# Patient Record
Sex: Male | Born: 1940
Health system: Southern US, Community
[De-identification: ages and names within clinical notes are randomized; demographics above are authoritative.]

## PROBLEM LIST (undated history)

## (undated) DIAGNOSIS — E785 Hyperlipidemia, unspecified: Secondary | ICD-10-CM

## (undated) DIAGNOSIS — M109 Gout, unspecified: Secondary | ICD-10-CM

## (undated) DIAGNOSIS — K635 Polyp of colon: Secondary | ICD-10-CM

## (undated) DIAGNOSIS — E669 Obesity, unspecified: Secondary | ICD-10-CM

## (undated) DIAGNOSIS — E119 Type 2 diabetes mellitus without complications: Secondary | ICD-10-CM

## (undated) DIAGNOSIS — I10 Essential (primary) hypertension: Secondary | ICD-10-CM

## (undated) DIAGNOSIS — H269 Unspecified cataract: Secondary | ICD-10-CM

## (undated) HISTORY — DX: Gout, unspecified: M10.9

## (undated) HISTORY — DX: Polyp of colon: K63.5

## (undated) HISTORY — DX: Unspecified cataract: H26.9

## (undated) HISTORY — DX: Type 2 diabetes mellitus without complications: E11.9

## (undated) HISTORY — PX: CATARACT EXTRACTION W/ INTRAOCULAR LENS  IMPLANT, BILATERAL: SHX1307

## (undated) HISTORY — DX: Obesity, unspecified: E66.9

## (undated) HISTORY — DX: Essential (primary) hypertension: I10

## (undated) HISTORY — DX: Hyperlipidemia, unspecified: E78.5

---
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
## 1999-07-09 ENCOUNTER — Encounter: Payer: Self-pay | Admitting: Internal Medicine

## 1999-07-09 ENCOUNTER — Encounter: Admission: RE | Admit: 1999-07-09 | Discharge: 1999-07-09 | Payer: Self-pay | Admitting: Internal Medicine

## 2002-05-13 HISTORY — PX: COLONOSCOPY: SHX174

## 2008-11-07 ENCOUNTER — Encounter: Admission: RE | Admit: 2008-11-07 | Discharge: 2008-11-07 | Payer: Self-pay | Admitting: Internal Medicine

## 2012-10-08 ENCOUNTER — Encounter: Payer: Self-pay | Admitting: Gastroenterology

## 2012-10-14 ENCOUNTER — Encounter: Payer: Self-pay | Admitting: Gastroenterology

## 2012-11-16 ENCOUNTER — Encounter: Payer: Self-pay | Admitting: Gastroenterology

## 2013-01-15 ENCOUNTER — Ambulatory Visit (AMBULATORY_SURGERY_CENTER): Payer: Self-pay | Admitting: *Deleted

## 2013-01-15 VITALS — Ht 70.0 in | Wt 212.4 lb

## 2013-01-15 DIAGNOSIS — Z86010 Personal history of colonic polyps: Secondary | ICD-10-CM

## 2013-01-15 MED ORDER — NA SULFATE-K SULFATE-MG SULF 17.5-3.13-1.6 GM/177ML PO SOLN: 1.0000 | Freq: Once | ORAL | Status: DC

## 2013-01-15 NOTE — Progress Notes (Signed)
Denies allergies to eggs or soy products. Denies complications with sedation or anesthesia. 

## 2013-01-18 ENCOUNTER — Encounter: Payer: Self-pay | Admitting: Gastroenterology

## 2013-01-29 ENCOUNTER — Encounter: Payer: Self-pay | Admitting: Gastroenterology

## 2013-03-23 ENCOUNTER — Other Ambulatory Visit: Payer: Self-pay | Admitting: Internal Medicine

## 2013-03-23 DIAGNOSIS — N289 Disorder of kidney and ureter, unspecified: Secondary | ICD-10-CM

## 2013-03-29 ENCOUNTER — Ambulatory Visit
Admission: RE | Admit: 2013-03-29 | Discharge: 2013-03-29 | Disposition: A | Discharge status: Home / Self Care | Payer: Medicare HMO | Source: Ambulatory Visit | Attending: Internal Medicine | Admitting: Internal Medicine

## 2013-03-29 DIAGNOSIS — N289 Disorder of kidney and ureter, unspecified: Secondary | ICD-10-CM

## 2013-03-29 DIAGNOSIS — R7989 Other specified abnormal findings of blood chemistry: Secondary | ICD-10-CM

## 2013-08-02 ENCOUNTER — Encounter: Payer: Self-pay | Admitting: Gastroenterology

## 2013-09-16 ENCOUNTER — Ambulatory Visit (AMBULATORY_SURGERY_CENTER): Payer: Self-pay | Admitting: *Deleted

## 2013-09-16 VITALS — Ht 70.0 in | Wt 215.2 lb

## 2013-09-16 DIAGNOSIS — Z1211 Encounter for screening for malignant neoplasm of colon: Secondary | ICD-10-CM

## 2013-09-16 NOTE — Progress Notes (Signed)
No allergies to eggs or soy. No problems with anesthesia.  No oxygen use  No diet drug use  Pt has Suprep from previously scheduled colonoscopy

## 2013-09-22 ENCOUNTER — Encounter: Payer: Self-pay | Admitting: Gastroenterology

## 2013-09-30 ENCOUNTER — Encounter: Payer: Self-pay | Admitting: Gastroenterology

## 2013-09-30 ENCOUNTER — Ambulatory Visit (AMBULATORY_SURGERY_CENTER): Payer: Medicare HMO | Admitting: Gastroenterology

## 2013-09-30 VITALS — BP 137/68 | HR 46 | Temp 96.6°F | Resp 11 | Ht 70.0 in | Wt 215.0 lb

## 2013-09-30 DIAGNOSIS — Z1211 Encounter for screening for malignant neoplasm of colon: Secondary | ICD-10-CM

## 2013-09-30 DIAGNOSIS — D126 Benign neoplasm of colon, unspecified: Secondary | ICD-10-CM

## 2013-09-30 MED ORDER — SODIUM CHLORIDE 0.9 % IV SOLN: 500.0000 mL | INTRAVENOUS | Status: DC

## 2013-09-30 NOTE — Patient Instructions (Signed)
YOU HAD AN ENDOSCOPIC PROCEDURE TODAY AT THE [facility]: Refer to the procedure report that was given to you for any specific questions about what was found during the examination.  If the procedure report does not answer your questions, please call your gastroenterologist to clarify.  If you requested that your care partner not be given the details of your procedure findings, then the procedure report has been included in a sealed envelope for you to review at your convenience later.  YOU SHOULD EXPECT: Some feelings of bloating in the abdomen. Passage of more gas than usual.  Walking can help get rid of the air that was put into your GI tract during the procedure and reduce the bloating. If you had a lower endoscopy (such as a colonoscopy or flexible sigmoidoscopy) you may notice spotting of blood in your stool or on the toilet paper. If you underwent a bowel prep for your procedure, then you may not have a normal bowel movement for a few days.  DIET: Your first meal following the procedure should be a light meal and then it is ok to progress to your normal diet.  A half-sandwich or bowl of soup is an example of a good first meal.  Heavy or fried foods are harder to digest and may make you feel nauseous or bloated.  Likewise meals heavy in dairy and vegetables can cause extra gas to form and this can also increase the bloating.  Drink plenty of fluids but you should avoid alcoholic beverages for 24 hours.  ACTIVITY: Your care partner should take you home directly after the procedure.  You should plan to take it easy, moving slowly for the rest of the day.  You can resume normal activity the day after the procedure however you should NOT DRIVE or use heavy machinery for 24 hours (because of the sedation medicines used during the test).    SYMPTOMS TO REPORT IMMEDIATELY: A gastroenterologist can be reached at any hour.  During normal business hours, 8:30 AM to 5:00 PM Monday through Friday,  call [phone].  After hours and on weekends, please call the GI answering service at [phone] who will take a message and have the physician on call contact you.   Following lower endoscopy (colonoscopy or flexible sigmoidoscopy):  Excessive amounts of blood in the stool  Significant tenderness or worsening of abdominal pains  Swelling of the abdomen that is new, acute  Fever of 100F or higher    FOLLOW UP: If any biopsies were taken you will be contacted by phone or by letter within the next 1-3 weeks.  Call your gastroenterologist if you have not heard about the biopsies in 3 weeks.  Our staff will call the home number listed on your records the next business day following your procedure to check on you and address any questions or concerns that you may have at that time regarding the information given to you following your procedure. This is a courtesy call and so if there is no answer at the home number and we have not heard from you through the emergency physician on call, we will assume that you have returned to your regular daily activities without incident.  SIGNATURES/CONFIDENTIALITY: You and/or your care partner have signed paperwork which will be entered into your electronic medical record.  These signatures attest to the fact that that the information above on your After Visit Summary has been reviewed and is understood.  Full responsibility of the confidentiality   of this discharge information lies with you and/or your care-partner.     

## 2013-09-30 NOTE — Progress Notes (Signed)
To PACU, report to RN awake and alert.

## 2013-09-30 NOTE — Op Note (Signed)
[place]  [address] [place], [id number]   COLONOSCOPY PROCEDURE REPORT  PATIENT: Andre, Chen  MR#: [id number] BIRTHDATE: [birth year] , 4  yrs. old GENDER: Male ENDOSCOPIST: Inda Castle, MD REFERRED OA:CZYSA Nyoka Cowden, M.D. PROCEDURE DATE:  09/30/2013 PROCEDURE:   Colonoscopy with cold biopsy polypectomy First Screening Colonoscopy - Avg.  risk and is 50 yrs.  old or older - No.  Prior Negative Screening - Now for repeat screening. 10 or more years since last screening  History of Adenoma - Now for follow-up colonoscopy & has been > or = to 3 yrs.  N/A  Polyps Removed Today? Yes. ASA CLASS:   Class II INDICATIONS:Average risk patient for colon cancer. MEDICATIONS: MAC sedation, administered by CRNA and propofol (Diprivan) 200mg  IV  DESCRIPTION OF PROCEDURE:   After the risks benefits and alternatives of the procedure were thoroughly explained, informed consent was obtained.  A digital rectal exam revealed no abnormalities of the rectum.   The LB [id number]  endoscope was introduced through the anus and advanced to the cecum, which was identified by both the appendix and ileocecal valve. No adverse events experienced.   The quality of the prep was excellent using Suprep  The instrument was then slowly withdrawn as the colon was fully examined.      COLON FINDINGS: A sessile polyp measuring 2 mm in size was found in the descending colon.  A polypectomy was performed with cold forceps.   The colon was otherwise normal.  There was no diverticulosis, inflammation, polyps or cancers unless previously stated.  Retroflexed views revealed no abnormalities. The time to cecum=3 minutes 21 seconds.  Withdrawal time=14 minutes 27 seconds. The scope was withdrawn and the procedure completed. COMPLICATIONS: There were no complications.  ENDOSCOPIC IMPRESSION: 1.   Sessile polyp measuring 2 mm in size was found in the descending colon; polypectomy was  performed with cold forceps 2.   The colon was otherwise normal  RECOMMENDATIONS: If the polyp(s) removed today are proven to be adenomatous (pre-cancerous) polyps, you will need a repeat colonoscopy in 5 years.  Otherwise you should continue to follow colorectal cancer screening guidelines for "routine risk" patients with colonoscopy in 10 years.  You will receive a letter within 1-2 weeks with the results of your biopsy as well as final recommendations.  Please call my office if you have not received a letter after 3 weeks.   eSigned:  Inda Castle, MD 09/30/2013 10:05 AM   cc:   PATIENT NAME:  Andre Chen, Andre Chen MR#: [id number]

## 2013-09-30 NOTE — Progress Notes (Signed)
Called to room to assist during endoscopic procedure.  Patient ID and intended procedure confirmed with present staff. Received instructions for my participation in the procedure from the performing physician.  

## 2013-10-01 ENCOUNTER — Telehealth: Payer: Self-pay | Admitting: *Deleted

## 2013-10-01 NOTE — Telephone Encounter (Signed)
  Follow up Call-  Call back number 09/30/2013  Post procedure Call Back phone  # [phone]  Permission to leave phone message Yes     Patient questions:  Message left for patient to call us if necessary.

## 2013-10-08 ENCOUNTER — Encounter: Payer: Self-pay | Admitting: Gastroenterology

## 2014-02-14 ENCOUNTER — Other Ambulatory Visit: Payer: Self-pay | Admitting: Internal Medicine

## 2014-02-14 ENCOUNTER — Ambulatory Visit
Admission: RE | Admit: 2014-02-14 | Discharge: 2014-02-14 | Disposition: A | Discharge status: Home / Self Care | Payer: Medicare HMO | Source: Ambulatory Visit | Attending: Internal Medicine | Admitting: Internal Medicine

## 2014-02-14 DIAGNOSIS — R059 Cough, unspecified: Secondary | ICD-10-CM

## 2014-02-14 DIAGNOSIS — R05 Cough: Secondary | ICD-10-CM

## 2014-06-02 ENCOUNTER — Other Ambulatory Visit: Payer: Self-pay | Admitting: Internal Medicine

## 2014-06-02 DIAGNOSIS — R7989 Other specified abnormal findings of blood chemistry: Secondary | ICD-10-CM

## 2014-06-02 DIAGNOSIS — R945 Abnormal results of liver function studies: Secondary | ICD-10-CM

## 2014-06-02 DIAGNOSIS — N289 Disorder of kidney and ureter, unspecified: Secondary | ICD-10-CM

## 2014-06-09 ENCOUNTER — Ambulatory Visit
Admission: RE | Admit: 2014-06-09 | Discharge: 2014-06-09 | Disposition: A | Discharge status: Home / Self Care | Payer: PPO | Source: Ambulatory Visit | Attending: Internal Medicine | Admitting: Internal Medicine

## 2014-06-09 DIAGNOSIS — N289 Disorder of kidney and ureter, unspecified: Secondary | ICD-10-CM

## 2014-06-09 DIAGNOSIS — R7989 Other specified abnormal findings of blood chemistry: Secondary | ICD-10-CM

## 2014-06-09 DIAGNOSIS — R945 Abnormal results of liver function studies: Secondary | ICD-10-CM

## 2015-12-11 DIAGNOSIS — E7800 Pure hypercholesterolemia, unspecified: Secondary | ICD-10-CM | POA: Diagnosis not present

## 2015-12-11 DIAGNOSIS — Z0000 Encounter for general adult medical examination without abnormal findings: Secondary | ICD-10-CM | POA: Diagnosis not present

## 2015-12-11 DIAGNOSIS — H6120 Impacted cerumen, unspecified ear: Secondary | ICD-10-CM | POA: Diagnosis not present

## 2015-12-11 DIAGNOSIS — Z125 Encounter for screening for malignant neoplasm of prostate: Secondary | ICD-10-CM | POA: Diagnosis not present

## 2015-12-11 DIAGNOSIS — D559 Anemia due to enzyme disorder, unspecified: Secondary | ICD-10-CM | POA: Diagnosis not present

## 2015-12-11 DIAGNOSIS — I10 Essential (primary) hypertension: Secondary | ICD-10-CM | POA: Diagnosis not present

## 2015-12-11 DIAGNOSIS — M109 Gout, unspecified: Secondary | ICD-10-CM | POA: Diagnosis not present

## 2016-01-16 DIAGNOSIS — I10 Essential (primary) hypertension: Secondary | ICD-10-CM | POA: Diagnosis not present

## 2016-01-16 DIAGNOSIS — Z23 Encounter for immunization: Secondary | ICD-10-CM | POA: Diagnosis not present

## 2016-01-16 DIAGNOSIS — D559 Anemia due to enzyme disorder, unspecified: Secondary | ICD-10-CM | POA: Diagnosis not present

## 2016-01-16 DIAGNOSIS — R799 Abnormal finding of blood chemistry, unspecified: Secondary | ICD-10-CM | POA: Diagnosis not present

## 2016-02-14 ENCOUNTER — Other Ambulatory Visit (HOSPITAL_COMMUNITY): Payer: Self-pay | Admitting: Internal Medicine

## 2016-02-19 ENCOUNTER — Other Ambulatory Visit: Payer: Self-pay | Admitting: Internal Medicine

## 2016-02-21 ENCOUNTER — Ambulatory Visit (HOSPITAL_COMMUNITY)
Admission: RE | Admit: 2016-02-21 | Discharge: 2016-02-21 | Disposition: A | Discharge status: Home / Self Care | Payer: PPO | Source: Ambulatory Visit | Attending: Internal Medicine | Admitting: Internal Medicine

## 2016-02-21 DIAGNOSIS — I340 Nonrheumatic mitral (valve) insufficiency: Secondary | ICD-10-CM | POA: Diagnosis not present

## 2016-02-21 LAB — CREATININE, SERUM
Creatinine, Ser: 1.82 mg/dL — ABNORMAL HIGH (ref 0.61–1.24)
GFR calc Af Amer: 41 mL/min — ABNORMAL LOW (ref >60)
GFR calc non Af Amer: 35 mL/min — ABNORMAL LOW (ref >60)

## 2016-02-21 MED ORDER — GADOBENATE DIMEGLUMINE 529 MG/ML IV SOLN
40.0000 mL | Freq: Once | INTRAVENOUS | Status: AC
  Administered 2016-02-21: 38 mL via INTRAVENOUS

## 2016-03-08 ENCOUNTER — Other Ambulatory Visit: Payer: PPO

## 2016-03-18 ENCOUNTER — Ambulatory Visit
Admission: RE | Admit: 2016-03-18 | Discharge: 2016-03-18 | Disposition: A | Discharge status: Home / Self Care | Payer: PPO | Source: Ambulatory Visit | Attending: Internal Medicine | Admitting: Internal Medicine

## 2016-03-18 MED ORDER — GADOBENATE DIMEGLUMINE 529 MG/ML IV SOLN
10.0000 mL | Freq: Once | INTRAVENOUS | Status: AC | PRN
  Administered 2016-03-18: 10 mL via INTRAVENOUS

## 2016-04-18 DIAGNOSIS — R799 Abnormal finding of blood chemistry, unspecified: Secondary | ICD-10-CM | POA: Diagnosis not present

## 2016-04-18 DIAGNOSIS — I10 Essential (primary) hypertension: Secondary | ICD-10-CM | POA: Diagnosis not present

## 2016-04-18 DIAGNOSIS — M109 Gout, unspecified: Secondary | ICD-10-CM | POA: Diagnosis not present

## 2016-07-08 DIAGNOSIS — M1000 Idiopathic gout, unspecified site: Secondary | ICD-10-CM | POA: Diagnosis not present

## 2016-07-08 DIAGNOSIS — A35 Other tetanus: Secondary | ICD-10-CM | POA: Diagnosis not present

## 2016-07-08 DIAGNOSIS — R945 Abnormal results of liver function studies: Secondary | ICD-10-CM | POA: Diagnosis not present

## 2016-07-08 DIAGNOSIS — D559 Anemia due to enzyme disorder, unspecified: Secondary | ICD-10-CM | POA: Diagnosis not present

## 2016-07-08 DIAGNOSIS — I10 Essential (primary) hypertension: Secondary | ICD-10-CM | POA: Diagnosis not present

## 2016-11-19 DIAGNOSIS — H26493 Other secondary cataract, bilateral: Secondary | ICD-10-CM | POA: Diagnosis not present

## 2016-11-19 DIAGNOSIS — H52221 Regular astigmatism, right eye: Secondary | ICD-10-CM | POA: Diagnosis not present

## 2016-11-19 DIAGNOSIS — H53011 Deprivation amblyopia, right eye: Secondary | ICD-10-CM | POA: Diagnosis not present

## 2016-12-12 DIAGNOSIS — N189 Chronic kidney disease, unspecified: Secondary | ICD-10-CM | POA: Diagnosis not present

## 2016-12-12 DIAGNOSIS — Z0000 Encounter for general adult medical examination without abnormal findings: Secondary | ICD-10-CM | POA: Diagnosis not present

## 2016-12-12 DIAGNOSIS — M1000 Idiopathic gout, unspecified site: Secondary | ICD-10-CM | POA: Diagnosis not present

## 2016-12-12 DIAGNOSIS — E790 Hyperuricemia without signs of inflammatory arthritis and tophaceous disease: Secondary | ICD-10-CM | POA: Diagnosis not present

## 2016-12-12 DIAGNOSIS — K635 Polyp of colon: Secondary | ICD-10-CM | POA: Diagnosis not present

## 2016-12-12 DIAGNOSIS — R945 Abnormal results of liver function studies: Secondary | ICD-10-CM | POA: Diagnosis not present

## 2016-12-12 DIAGNOSIS — Z6841 Body Mass Index (BMI) 40.0 and over, adult: Secondary | ICD-10-CM | POA: Diagnosis not present

## 2016-12-13 ENCOUNTER — Other Ambulatory Visit: Payer: Self-pay | Admitting: Internal Medicine

## 2016-12-16 ENCOUNTER — Other Ambulatory Visit: Payer: Self-pay | Admitting: Internal Medicine

## 2016-12-16 DIAGNOSIS — N2889 Other specified disorders of kidney and ureter: Secondary | ICD-10-CM

## 2016-12-30 ENCOUNTER — Other Ambulatory Visit: Payer: Self-pay | Admitting: Internal Medicine

## 2016-12-30 ENCOUNTER — Ambulatory Visit
Admission: RE | Admit: 2016-12-30 | Discharge: 2016-12-30 | Disposition: A | Discharge status: Home / Self Care | Payer: PPO | Source: Ambulatory Visit | Attending: Internal Medicine | Admitting: Internal Medicine

## 2016-12-30 DIAGNOSIS — N281 Cyst of kidney, acquired: Secondary | ICD-10-CM | POA: Diagnosis not present

## 2016-12-30 DIAGNOSIS — N2889 Other specified disorders of kidney and ureter: Secondary | ICD-10-CM

## 2017-02-18 DIAGNOSIS — Z23 Encounter for immunization: Secondary | ICD-10-CM | POA: Diagnosis not present

## 2017-02-18 DIAGNOSIS — R945 Abnormal results of liver function studies: Secondary | ICD-10-CM | POA: Diagnosis not present

## 2017-02-18 DIAGNOSIS — N189 Chronic kidney disease, unspecified: Secondary | ICD-10-CM | POA: Diagnosis not present

## 2017-04-23 DIAGNOSIS — I10 Essential (primary) hypertension: Secondary | ICD-10-CM | POA: Diagnosis not present

## 2017-04-23 DIAGNOSIS — M1000 Idiopathic gout, unspecified site: Secondary | ICD-10-CM | POA: Diagnosis not present

## 2017-04-23 DIAGNOSIS — E790 Hyperuricemia without signs of inflammatory arthritis and tophaceous disease: Secondary | ICD-10-CM | POA: Diagnosis not present

## 2017-08-26 DIAGNOSIS — N281 Cyst of kidney, acquired: Secondary | ICD-10-CM | POA: Diagnosis not present

## 2017-08-26 DIAGNOSIS — M109 Gout, unspecified: Secondary | ICD-10-CM | POA: Diagnosis not present

## 2017-08-26 DIAGNOSIS — I10 Essential (primary) hypertension: Secondary | ICD-10-CM | POA: Diagnosis not present

## 2017-12-25 DIAGNOSIS — I10 Essential (primary) hypertension: Secondary | ICD-10-CM | POA: Diagnosis not present

## 2017-12-25 DIAGNOSIS — M109 Gout, unspecified: Secondary | ICD-10-CM | POA: Diagnosis not present

## 2017-12-25 DIAGNOSIS — K635 Polyp of colon: Secondary | ICD-10-CM | POA: Diagnosis not present

## 2017-12-25 DIAGNOSIS — Z1211 Encounter for screening for malignant neoplasm of colon: Secondary | ICD-10-CM | POA: Diagnosis not present

## 2017-12-25 DIAGNOSIS — R945 Abnormal results of liver function studies: Secondary | ICD-10-CM | POA: Diagnosis not present

## 2017-12-26 ENCOUNTER — Other Ambulatory Visit: Payer: Self-pay | Admitting: Internal Medicine

## 2017-12-26 DIAGNOSIS — N281 Cyst of kidney, acquired: Secondary | ICD-10-CM

## 2017-12-29 ENCOUNTER — Ambulatory Visit
Admission: RE | Admit: 2017-12-29 | Discharge: 2017-12-29 | Disposition: A | Discharge status: Home / Self Care | Payer: PPO | Source: Ambulatory Visit | Attending: Internal Medicine | Admitting: Internal Medicine

## 2017-12-29 DIAGNOSIS — N281 Cyst of kidney, acquired: Secondary | ICD-10-CM

## 2018-02-19 DIAGNOSIS — Z23 Encounter for immunization: Secondary | ICD-10-CM | POA: Diagnosis not present

## 2018-09-17 ENCOUNTER — Encounter: Payer: Self-pay | Admitting: Gastroenterology

## 2018-11-03 IMAGING — MR MR ABDOMEN W/O CM
6 of 8 series · 30 of 48 positions shown · non-contrast
Comparison: 03/18/2016

CLINICAL DATA: Follow-up Bosniak IIF lesion in the right kidney

EXAM:
MRI ABDOMEN WITHOUT CONTRAST
TECHNIQUE: Multiplanar multisequence MR imaging was performed without the
administration of intravenous contrast. Contrast could not be
administered due to patient's low GFR.

[Series 3: T2 · coronal · 5.0mm · 1.56mm/px · 3 of 20 slices shown (1 of 3)]
[im 1/20]
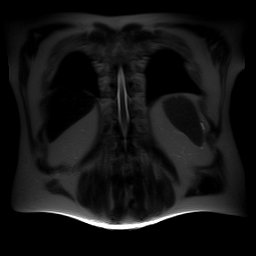
[im 10/20]
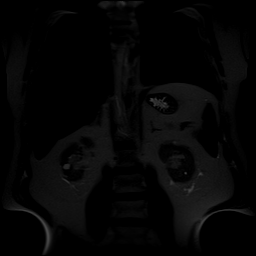
[im 20/20]
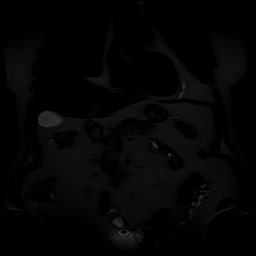

[Series 4: axial tru fisp · axial · 4.0mm · 1.48mm/px · z∈[-107,+75]mm · 5 of 36 slices shown]
[im 1/36]
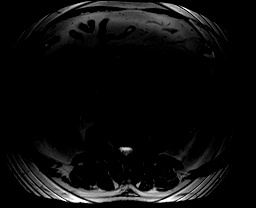
[im 9/36]
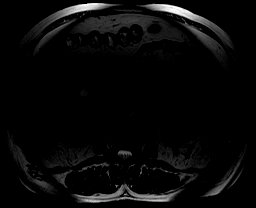
[im 18/36]
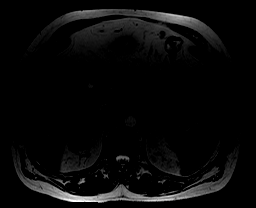
[im 27/36]
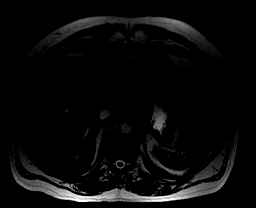
[im 36/36]
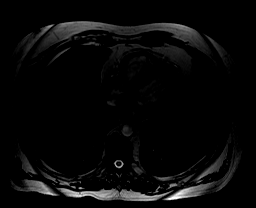

[Series 7: T2 · axial · 6.5mm · 0.74mm/px · z∈[-117,+101]mm · 4 of 29 slices shown (2 of 3)]
[im 1/29]
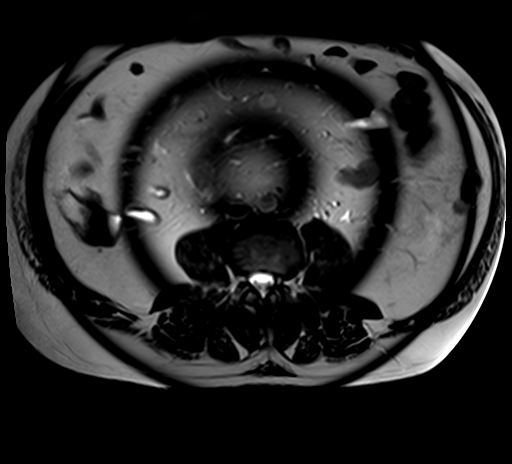
[im 10/29]
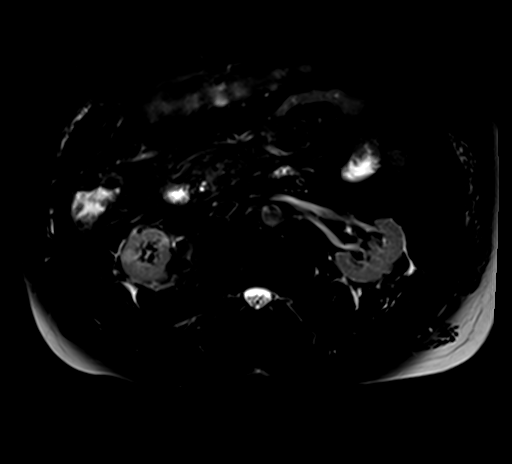
[im 19/29]
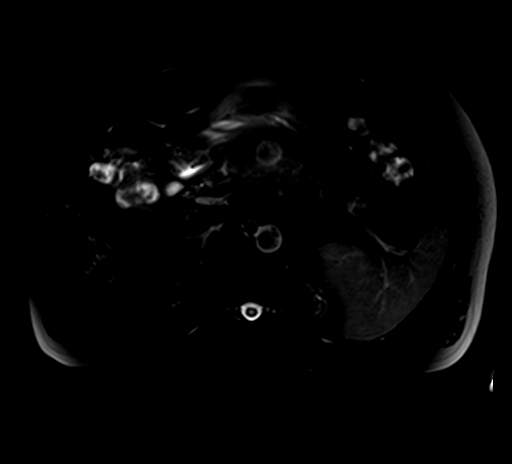
[im 29/29]
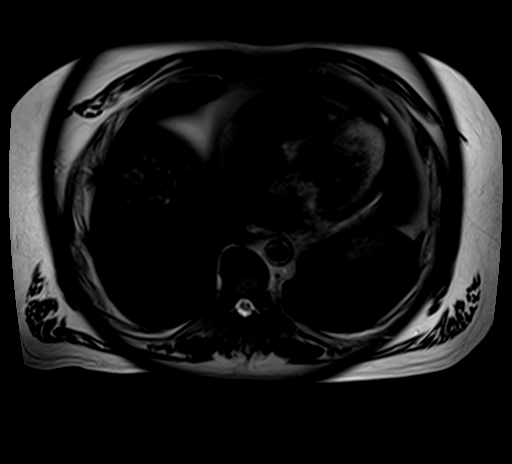

[Series 8: T2 · axial · 5.0mm · 1.48mm/px · z∈[-119,+96]mm · 4 of 34 slices shown (3 of 3)]
[im 1/34]
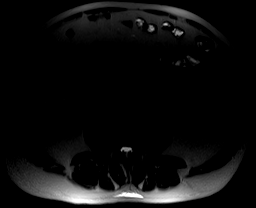
[im 12/34]
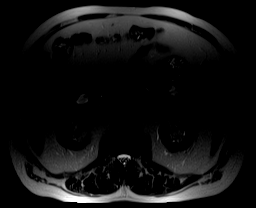
[im 23/34]
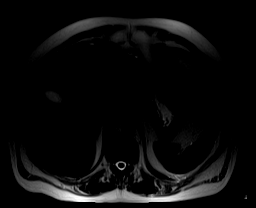
[im 34/34]
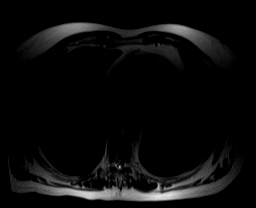

[Series 11: axial in out · axial · 5.5mm · 0.74mm/px · z∈[-110,+87]mm · 8 of 64 slices shown]
[im 1/64]
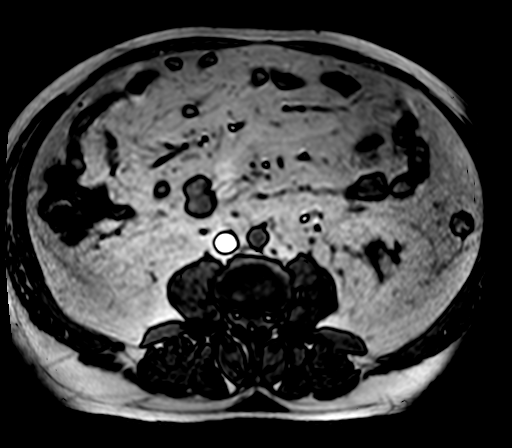
[im 10/64]
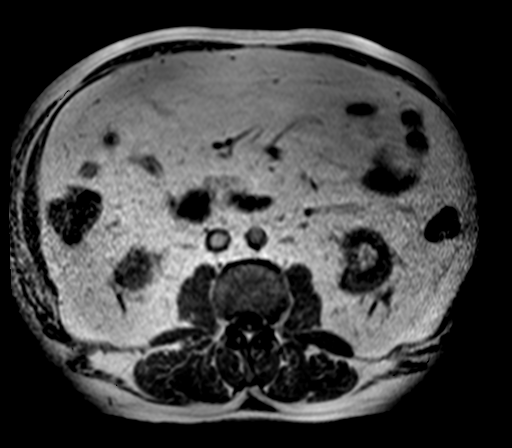
[im 19/64]
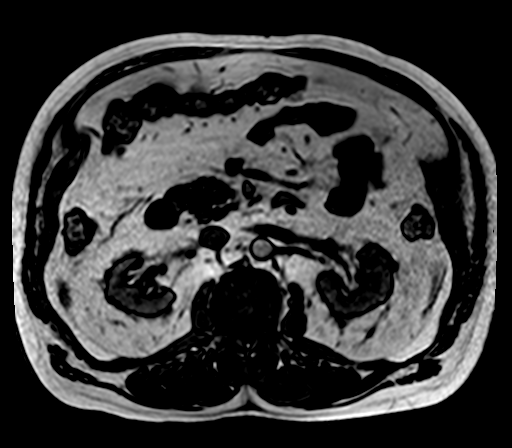
[im 28/64]
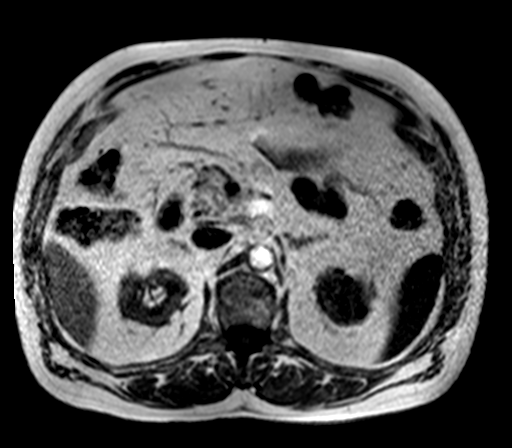
[im 37/64]
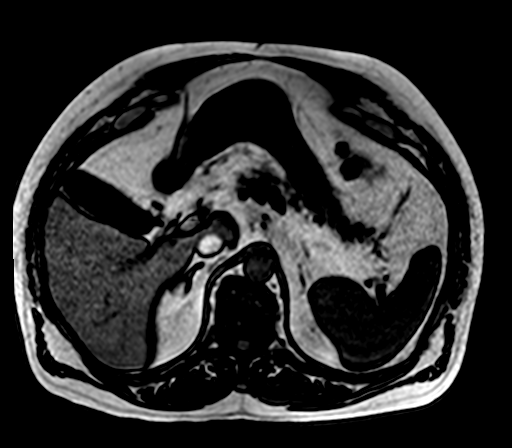
[im 46/64]
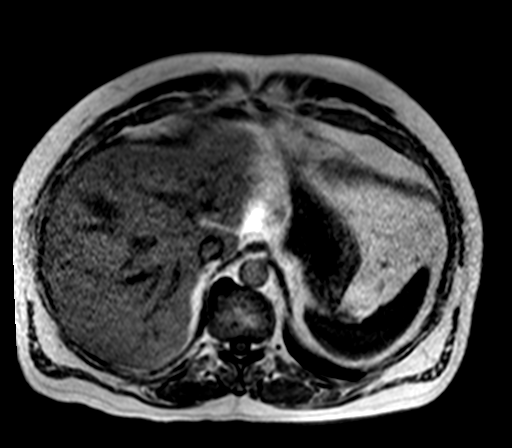
[im 55/64]
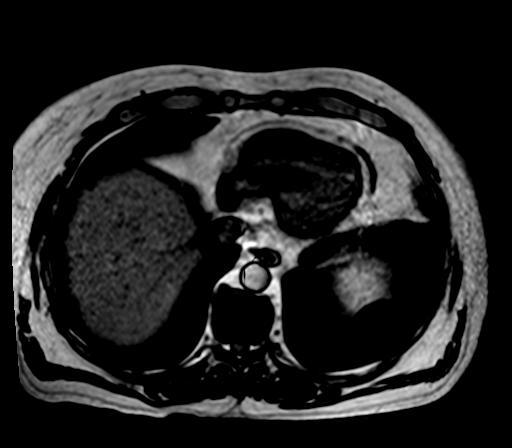
[im 64/64]
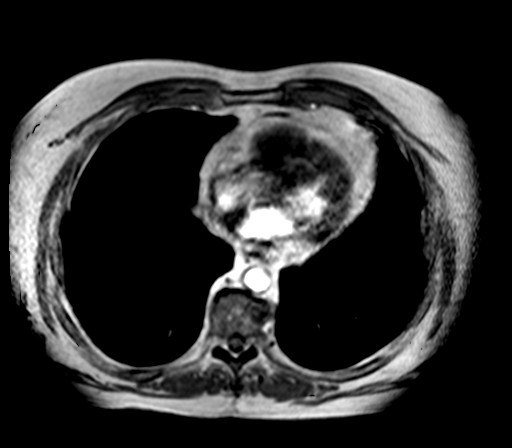

[Series 12: T1 dynamic · axial · non-contrast · 2.5mm · 0.78mm/px · z∈[-110,+27]mm · 6 of 80 slices shown]
[im 1/80]
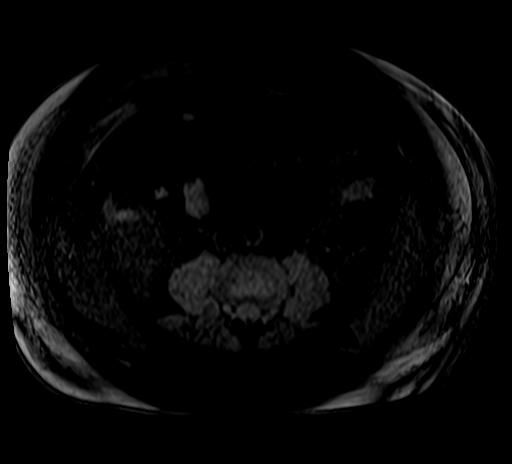
[im 16/80]
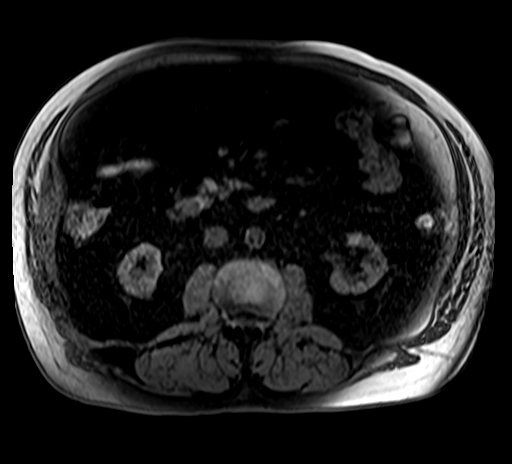
[im 24/80]
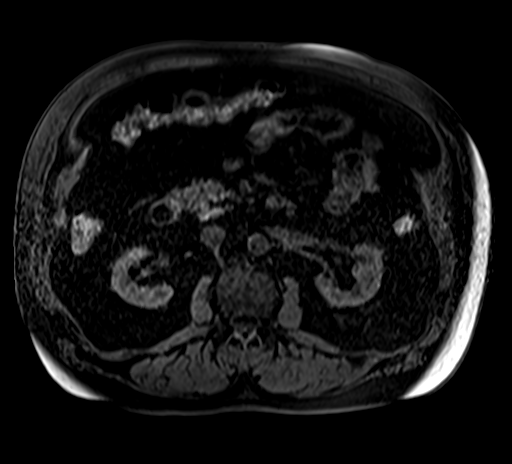
[im 32/80]
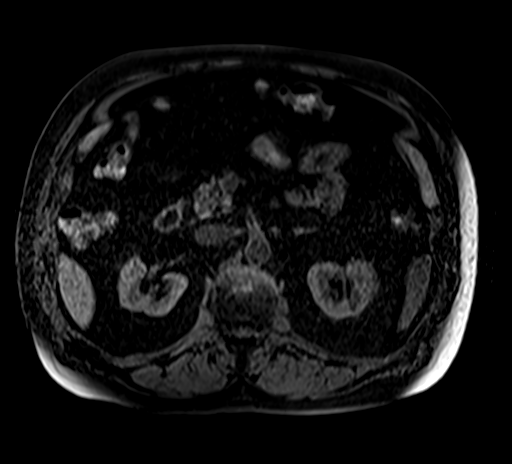
[im 48/80]
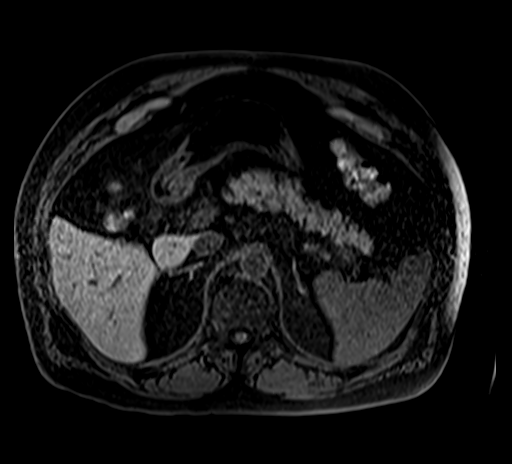
[im 56/80]
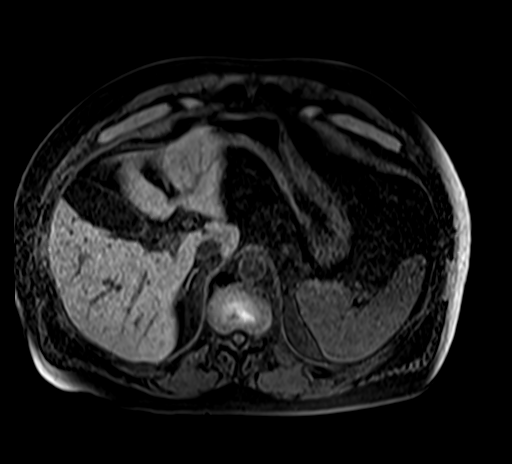

[30 of 48 positions shown; findings below may reference images not displayed]

FINDINGS: Lower chest: Lung bases are clear.

Hepatobiliary: Liver is within normal limits.

Gallbladder is unremarkable. No intrahepatic or extrahepatic ductal
dilatation.

Pancreas:  Within normal limits.

Spleen:  Within normal limits.

Adrenals/Urinary Tract:  Adrenal glands are within normal limits.

1.4 cm irregular/septated cyst in the lateral right lower kidney
(series 7/image 18). This is incompletely characterized in the
absence of intravenous contrast administration, but remains
compatible with a Bosniak IIF lesion.

Simple left renal cysts measuring up to 10 mm in the upper pole
(series 7/ image 15). No hydronephrosis.

Stomach/Bowel: Stomach and visualized bowel are unremarkable.

Vascular/Lymphatic:  No evidence of abdominal aortic aneurysm.

No suspicious abdominal lymphadenopathy.

Other:  No abdominal ascites.

Musculoskeletal: No focal osseous lesions.
IMPRESSION: 14 mm irregular/septated cyst in the lateral right lower kidney,
incompletely characterized in the absence of intravenous contrast,
Bosniak IIF.

Given the small size and the patient's age, consider follow-up MR in
12 months, as clinically warranted.

## 2019-01-21 DIAGNOSIS — I10 Essential (primary) hypertension: Secondary | ICD-10-CM | POA: Diagnosis not present

## 2019-01-21 DIAGNOSIS — E790 Hyperuricemia without signs of inflammatory arthritis and tophaceous disease: Secondary | ICD-10-CM | POA: Diagnosis not present

## 2019-01-21 DIAGNOSIS — Z125 Encounter for screening for malignant neoplasm of prostate: Secondary | ICD-10-CM | POA: Diagnosis not present

## 2019-01-21 DIAGNOSIS — K635 Polyp of colon: Secondary | ICD-10-CM | POA: Diagnosis not present

## 2019-01-21 DIAGNOSIS — Z1211 Encounter for screening for malignant neoplasm of colon: Secondary | ICD-10-CM | POA: Diagnosis not present

## 2019-01-21 DIAGNOSIS — M1000 Idiopathic gout, unspecified site: Secondary | ICD-10-CM | POA: Diagnosis not present

## 2019-01-21 DIAGNOSIS — M109 Gout, unspecified: Secondary | ICD-10-CM | POA: Diagnosis not present

## 2019-01-21 DIAGNOSIS — N200 Calculus of kidney: Secondary | ICD-10-CM | POA: Diagnosis not present

## 2019-01-21 DIAGNOSIS — C4492 Squamous cell carcinoma of skin, unspecified: Secondary | ICD-10-CM | POA: Diagnosis not present

## 2019-01-21 DIAGNOSIS — Z23 Encounter for immunization: Secondary | ICD-10-CM | POA: Diagnosis not present

## 2019-05-19 DIAGNOSIS — Z1331 Encounter for screening for depression: Secondary | ICD-10-CM | POA: Diagnosis not present

## 2019-05-19 DIAGNOSIS — R7989 Other specified abnormal findings of blood chemistry: Secondary | ICD-10-CM | POA: Diagnosis not present

## 2019-05-19 DIAGNOSIS — I10 Essential (primary) hypertension: Secondary | ICD-10-CM | POA: Diagnosis not present

## 2019-05-19 DIAGNOSIS — N1831 Chronic kidney disease, stage 3a: Secondary | ICD-10-CM | POA: Diagnosis not present

## 2019-05-19 DIAGNOSIS — N281 Cyst of kidney, acquired: Secondary | ICD-10-CM | POA: Diagnosis not present

## 2019-05-19 DIAGNOSIS — Z85828 Personal history of other malignant neoplasm of skin: Secondary | ICD-10-CM | POA: Diagnosis not present

## 2019-05-19 DIAGNOSIS — M109 Gout, unspecified: Secondary | ICD-10-CM | POA: Diagnosis not present

## 2019-05-19 DIAGNOSIS — R351 Nocturia: Secondary | ICD-10-CM | POA: Diagnosis not present

## 2019-06-25 DIAGNOSIS — Z7689 Persons encountering health services in other specified circumstances: Secondary | ICD-10-CM | POA: Diagnosis not present

## 2019-06-25 DIAGNOSIS — R7989 Other specified abnormal findings of blood chemistry: Secondary | ICD-10-CM | POA: Diagnosis not present

## 2019-06-29 ENCOUNTER — Encounter: Payer: Self-pay | Admitting: Gastroenterology

## 2019-07-01 ENCOUNTER — Telehealth: Payer: Self-pay | Admitting: Hematology

## 2019-07-01 NOTE — Telephone Encounter (Signed)
Received a new pt referral from Dr. Francesco Sor at [facility] for elevated ferritin, eval for hemochromatosis. I cld and spoke to the pt's wife to schedule an appt on 3/10 at 10am. Aware to arrive 15 minutes early.

## 2019-07-21 ENCOUNTER — Inpatient Hospital Stay: Payer: PPO

## 2019-07-21 ENCOUNTER — Inpatient Hospital Stay: Payer: PPO | Attending: Hematology | Admitting: Hematology

## 2019-07-21 ENCOUNTER — Other Ambulatory Visit: Payer: Self-pay

## 2019-07-21 DIAGNOSIS — R945 Abnormal results of liver function studies: Secondary | ICD-10-CM

## 2019-07-21 DIAGNOSIS — R7989 Other specified abnormal findings of blood chemistry: Secondary | ICD-10-CM | POA: Diagnosis not present

## 2019-07-21 DIAGNOSIS — N281 Cyst of kidney, acquired: Secondary | ICD-10-CM | POA: Diagnosis not present

## 2019-07-21 LAB — CMP (CANCER CENTER ONLY)
ALT: 86 U/L — ABNORMAL HIGH (ref 0–44)
AST: 39 U/L (ref 15–41)
Albumin: 4 g/dL (ref 3.5–5.0)
Alkaline Phosphatase: 88 U/L (ref 38–126)
Anion gap: 8 (ref 5–15)
BUN: 41 mg/dL — ABNORMAL HIGH (ref 8–23)
CO2: 27 mmol/L (ref 22–32)
Calcium: 9.6 mg/dL (ref 8.9–10.3)
Chloride: 105 mmol/L (ref 98–111)
Creatinine: 1.84 mg/dL — ABNORMAL HIGH (ref 0.61–1.24)
GFR, Est AFR Am: 40 mL/min — ABNORMAL LOW (ref >60)
GFR, Estimated: 35 mL/min — ABNORMAL LOW (ref >60)
Glucose, Bld: 286 mg/dL — ABNORMAL HIGH (ref 70–99)
Potassium: 5.1 mmol/L (ref 3.5–5.1)
Sodium: 140 mmol/L (ref 135–145)
Total Bilirubin: 0.6 mg/dL (ref 0.3–1.2)
Total Protein: 6.9 g/dL (ref 6.5–8.1)

## 2019-07-21 LAB — FERRITIN: Ferritin: 1227 ng/mL — ABNORMAL HIGH (ref 24–336)

## 2019-07-21 LAB — CBC WITH DIFFERENTIAL/PLATELET
Abs Immature Granulocytes: 0.03 10*3/uL (ref 0.00–0.07)
Basophils Absolute: 0 10*3/uL (ref 0.0–0.1)
Basophils Relative: 0 %
Eosinophils Absolute: 0.3 10*3/uL (ref 0.0–0.5)
Eosinophils Relative: 4 %
HCT: 40.3 % (ref 39.0–52.0)
Hemoglobin: 13.6 g/dL (ref 13.0–17.0)
Immature Granulocytes: 0 %
Lymphocytes Relative: 20 %
Lymphs Abs: 1.3 10*3/uL (ref 0.7–4.0)
MCH: 33.3 pg (ref 26.0–34.0)
MCHC: 33.7 g/dL (ref 30.0–36.0)
MCV: 98.5 fL (ref 80.0–100.0)
Monocytes Absolute: 0.6 10*3/uL (ref 0.1–1.0)
Monocytes Relative: 8 %
Neutro Abs: 4.6 10*3/uL (ref 1.7–7.7)
Neutrophils Relative %: 68 %
Platelets: 126 10*3/uL — ABNORMAL LOW (ref 150–400)
RBC: 4.09 MIL/uL — ABNORMAL LOW (ref 4.22–5.81)
RDW: 11.6 % (ref 11.5–15.5)
WBC: 6.7 10*3/uL (ref 4.0–10.5)
nRBC: 0 % (ref 0.0–0.2)

## 2019-07-21 LAB — HEPATITIS C ANTIBODY: HCV Ab: NONREACTIVE

## 2019-07-21 LAB — IRON AND TIBC
Iron: 123 ug/dL (ref 42–163)
Saturation Ratios: 41 % (ref 20–55)
TIBC: 296 ug/dL (ref 202–409)
UIBC: 174 ug/dL (ref 117–376)

## 2019-07-21 LAB — HEPATITIS B SURFACE ANTIGEN: Hepatitis B Surface Ag: NONREACTIVE

## 2019-07-21 LAB — HEPATITIS B CORE ANTIBODY, TOTAL: Hep B Core Total Ab: NONREACTIVE

## 2019-07-21 NOTE — Patient Instructions (Signed)
Thank you for choosing [facility] to provide your oncology and hematology care.   Should you have questions after your visit to the [facility] ([facility]), please contact this office at [phone] between 8:30 AM and 4:30 PM.  Voice mails left after 4:00 PM may not be returned until the following business day.  Calls received after 4:30 PM will be answered by an off-site Nurse Triage Line.    Prescription Refills:  Please have your pharmacy contact us directly for most prescription requests.  Contact the office directly for refills of narcotics (pain medications). Allow 48-72 hours for refills.  Appointments: Please contact the [facility] scheduling department [phone] for questions regarding [facility] appointment scheduling.  Contact the schedulers with any scheduling changes so that your appointment can be rescheduled in a timely manner.   Central Scheduling for [place] [phone] - Call to schedule procedures such as PET scans, CT scans, MRI, Ultrasound, etc.  To afford each patient quality time with our providers, please arrive 30 minutes before your scheduled appointment time.  If you arrive late for your appointment, you may be asked to reschedule.  We strive to give you quality time with our providers, and arriving late affects you and other patients whose appointments are after yours. If you are a no show for multiple scheduled visits, you may be dismissed from the clinic at the providers discretion.     Resources: [facility] Social Workers [phone] for additional information on assistance programs or assistance connecting with community support programs   [place] DSS  336-641-3447: Information regarding food stamps, Medicaid, and utility assistance [place]AT 336-333-6589   [place] [employer]'s shared-ride transportation service for eligible riders who have a disability that prevents them from riding the fixed route bus.   Medicare Rights Center  800-333-4114 Helps people with Medicare understand their rights and benefits, navigate the Medicare system, and secure the quality healthcare they deserve [employer] 800-227-2345 Assists patients locate various types of support and financial assistance Cancer Care: 1-800-813-HOPE (4673) Provides financial assistance, online support groups, medication/co-pay assistance.   Transportation Assistance for appointments at [facility]: Transportation Coordinator [phone]  Again, thank you for choosing [facility] for your care.       

## 2019-07-21 NOTE — Progress Notes (Signed)
HEMATOLOGY/ONCOLOGY CONSULTATION NOTE  Date of Service: 07/21/2019  Patient Care Team: Sueanne Margarita, DO as PCP - General (Internal Medicine)  CHIEF COMPLAINTS/PURPOSE OF CONSULTATION:  Elevated Ferritin/Evaluation for Hemochromatosis  HISTORY OF PRESENTING ILLNESS:   Andre Chen is a wonderful 79 y.o. male who has been referred to [place] by Dr Francesco Sor for evaluation and management of elevated ferritin/evaluation for hemochromatosis. Pt is accompanied today by [name]. The pt reports that he is doing well overall.   The pt currently follows with Dr. Sueanne Margarita for primary care but used to see Dr. Carlota Raspberry prior to his retirement. The pt reports that he has no family history of hemachromatosis and no history of heart disease or liver failure. Pt has not historically been a heavy drinker. It has been three years since he has had any alcohol and when he was drinking it was under one pack of beer per year. He has never been on iron supplements but has a significant history of cooking meals in cast-iron skillets. Pt eats steaks about once per week. Pt denies ever receiving a blood transfusion. Pt is unable to give blood, himself, due to a Hepatitis infection when he was in 3rd grade. Pt does not believe that he was ever diagnosed with hemochromatosis but notes that Dr. Carlota Raspberry discussed that at one point the pt may need therapeutic phlebotomies.   He was first told that he was having some liver dysfunction about 8 years ago. There has been some concern that the liver dysfunction was caused by Colchicine, which pt has been taking for many years. Pt believes that his gout triggers include ice cream, pecans, fish and shrimp. He has an upcoming appointment with Dr. Wilfrid Lund on 03/16 for further w/o of liver dysfunction.    Pt has dry, flaking skin that comes off in large patches. The patches occur most often on his head and around the nasolabial folds of his face. Pt uses OTC cream for his skin  condition. He has never been diagnosed with Psoriasis and has declined previous Dermatology referrals. Pt has had BCC and SCC removed by Dr. Carlota Raspberry.   Of note prior to the patient's visit today, pt has had [place] Abd ([id number]) completed on 12/29/2017 with results revealing "1. Known renal cysts are not well visualized by ultrasound. 2. Mildly heterogeneous liver may reflect underlying liver disease."   Most recent lab results (06/25/2019) of CMP is as follows: all values are WNL except for Glucose at 208, BUN at 34, Creatinine at 1.7, ALT at 94. 06/25/2019 Ferritin at 1017.7 06/25/2019 Iron and TIBC is as follows: Iron at 166, Iron Sat at 64, TIBC at 261, UIBC at 95.  On review of systems, pt denies abdominal pain, leg swelling, fatigue and any other symptoms.   On PMHx the pt reports Gout, HTN, BPH, BCC, SCC. On Social Hx the pt reports that he rarely drinks alcohol.   MEDICAL HISTORY:  Past Medical History:  Diagnosis Date  . Cataracts, bilateral   . Gout   . Hypertension   Benign Prostatic Hyperplasia BCC  SCC   SURGICAL HISTORY: Past Surgical History:  Procedure Laterality Date  . APPENDECTOMY  1957  . CATARACT EXTRACTION W/ INTRAOCULAR LENS  IMPLANT, BILATERAL  1/15 and 2/15  . COLONOSCOPY  2004    SOCIAL HISTORY: Social History   Socioeconomic History  . Marital status: Married    Spouse name: Not on file  . Number of children: Not on file  .  Years of education: Not on file  . Highest education level: Not on file  Occupational History  . Not on file  Tobacco Use  . Smoking status: Former Smoker    Quit date: 05/13/1972    Years since quitting: 47.2  . Smokeless tobacco: Former Systems developer    Quit date: 01/22/2000  Substance and Sexual Activity  . Alcohol use: No  . Drug use: No  . Sexual activity: Not on file  Other Topics Concern  . Not on file  Social History Narrative  . Not on file   Social Determinants of Health   Financial Resource Strain:   . Difficulty  of Paying Living Expenses: Not on file  Food Insecurity:   . Worried About Charity fundraiser in the Last Year: Not on file  . Ran Out of Food in the Last Year: Not on file  Transportation Needs:   . Lack of Transportation (Medical): Not on file  . Lack of Transportation (Non-Medical): Not on file  Physical Activity:   . Days of Exercise per Week: Not on file  . Minutes of Exercise per Session: Not on file  Stress:   . Feeling of Stress : Not on file  Social Connections:   . Frequency of Communication with Friends and Family: Not on file  . Frequency of Social Gatherings with Friends and Family: Not on file  . Attends Religious Services: Not on file  . Active Member of Clubs or Organizations: Not on file  . Attends Archivist Meetings: Not on file  . Marital Status: Not on file  Intimate Partner Violence:   . Fear of Current or Ex-Partner: Not on file  . Emotionally Abused: Not on file  . Physically Abused: Not on file  . Sexually Abused: Not on file    FAMILY HISTORY: Family History  Problem Relation Age of Onset  . Colon cancer Neg Hx   . Esophageal cancer Neg Hx   . Rectal cancer Neg Hx   . Stomach cancer Neg Hx     ALLERGIES:  has No Known Allergies.  MEDICATIONS:  Current Outpatient Medications  Medication Sig Dispense Refill  . atenolol-chlorthalidone (TENORETIC) 100-25 MG per tablet Take 1 tablet by mouth daily.    . colchicine 0.6 MG tablet Take 0.6 mg by mouth daily.    Marland Kitchen doxazosin (CARDURA) 2 MG tablet Take 2 mg by mouth at bedtime.    . Febuxostat (ULORIC) 80 MG TABS Take by mouth daily.    . valsartan (DIOVAN) 320 MG tablet Take 320 mg by mouth daily.     No current facility-administered medications for this visit.    REVIEW OF SYSTEMS:    10 Point review of Systems was done is negative except as noted above.  PHYSICAL EXAMINATION: ECOG PERFORMANCE STATUS: 0 - Asymptomatic  . Vitals:   07/21/19 1017  BP: (!) 172/74  Pulse: 64  Resp:  17  Temp: 98.5 F (36.9 C)  SpO2: 99%   Filed Weights   07/21/19 1017  Weight: 216 lb (98 kg)   .Body mass index is 30.99 kg/m.  GENERAL:alert, in no acute distress and comfortable SKIN: no acute rashes, no significant lesions EYES: conjunctiva are pink and non-injected, sclera anicteric OROPHARYNX: MMM, no exudates, no oropharyngeal erythema or ulceration NECK: supple, no JVD LYMPH:  no palpable lymphadenopathy in the cervical, axillary or inguinal regions LUNGS: clear to auscultation b/l with normal respiratory effort HEART: regular rate & rhythm ABDOMEN:  normoactive bowel  sounds , non tender, not distended. Extremity: no pedal edema PSYCH: alert & oriented x 3 with fluent speech NEURO: no focal motor/sensory deficits  LABORATORY DATA:  I have reviewed the data as listed  . CBC Latest Ref Rng & Units 07/21/2019  WBC 4.0 - 10.5 K/uL 6.7  Hemoglobin 13.0 - 17.0 g/dL 13.6  Hematocrit 39.0 - 52.0 % 40.3  Platelets 150 - 400 K/uL 126(L)    . CMP Latest Ref Rng & Units 07/21/2019 02/21/2016  Glucose 70 - 99 mg/dL 286(H) -  BUN 8 - 23 mg/dL 41(H) -  Creatinine 0.61 - 1.24 mg/dL 1.84(H) 1.82(H)  Sodium 135 - 145 mmol/L 140 -  Potassium 3.5 - 5.1 mmol/L 5.1 -  Chloride 98 - 111 mmol/L 105 -  CO2 22 - 32 mmol/L 27 -  Calcium 8.9 - 10.3 mg/dL 9.6 -  Total Protein 6.5 - 8.1 g/dL 6.9 -  Total Bilirubin 0.3 - 1.2 mg/dL 0.6 -  Alkaline Phos 38 - 126 U/L 88 -  AST 15 - 41 U/L 39 -  ALT 0 - 44 U/L 86(H) -     RADIOGRAPHIC STUDIES: I have personally reviewed the radiological images as listed and agreed with the findings in the report. No results found.  ASSESSMENT & PLAN:   43 y/omale with   1) Elevated ferritin levels concerning for hereditary hemochromatosis vs secondary hemosiderosis from chronic liver disease. PLAN: -Discussed patient's most recent labs from 06/25/2019, all values are WNL except for Glucose at 208, BUN at 34, Creatinine at 1.7, ALT at  94. -Discussed 06/25/2019 Ferritin at 1017.7 -Discussed 06/25/2019 Iron and TIBC is as follows: Iron at 166, Iron Sat at 64, TIBC at 261, UIBC at 95. -Discussed 12/29/2017 [place] Abd ([id number]) which revealed "1. Known renal cysts are not well visualized by ultrasound. 2. Mildly heterogeneous liver may reflect underlying liver disease."  -Advised pt that Ferritin levels > 800 - 1000 could cause organ damage, most often heart and liver dysfunction  -Advised pt that for consistently elevated Ferritin would need to give therapeutic phlebotomies -Advised pt that there is a chance that he has Hereditary Hemochromatosis, which would be important knowledge for their children -Advised pt that a liver biopsy is the best way to elevated extent of liver iron overload and liver injury and ?fibrosis but is invasive  -Advised pt that a specialized MRI could be used to diagnose hemochromatosis but would need to be done at another facility  -Advised pt that alcohol, cast iron skillets, foods with high Vitamin C levels, and red meats contain a high amount of iron and to avoid or minimize when possible  -Advised pt that continued high levels of iron intake may increase the frequency of therapeutic phlebotomies needed -Will need to determine if liver is leaking iron or if pt has vast excess of iron stores -Plan to give pt one therapeutic phlebotomy every other week based on labs -Will get labs today  -Will get [place] Abd with Elastography in one week -Recommend pt f/u with Dr. Loletha Carrow as scheduled for evaluation of hisliver abnormalities -Will see back as needed based on labs     FOLLOW UP: Labs today [place] abd with elastography in 1 week RTC with Dr Irene Limbo based on labs    All of the patients questions were answered with apparent satisfaction. The patient knows to call the clinic with any problems, questions or concerns.  I spent 40 mins counseling the patient face to face. The total time spent  in the appointment was  34mins minutes and more than 50% was on counseling and direct patient cares.    Sullivan Lone MD [place] AAHIVMS [facility] Hematology/Oncology Physician [facility]  (Office):       [phone] (Work cell):  [phone] (Fax):           [phone]  07/21/2019 4:42 PM   I, Yevette Edwards, am acting as a scribe for Dr. Sullivan Lone.   .I have reviewed the above documentation for accuracy and completeness, and I agree with the above. Brunetta Genera MD

## 2019-07-26 LAB — HEMOCHROMATOSIS DNA-PCR(C282Y,H63D)

## 2019-07-27 ENCOUNTER — Ambulatory Visit (INDEPENDENT_AMBULATORY_CARE_PROVIDER_SITE_OTHER): Payer: PPO | Admitting: Gastroenterology

## 2019-07-27 ENCOUNTER — Encounter: Payer: Self-pay | Admitting: Gastroenterology

## 2019-07-27 VITALS — BP 140/70 | HR 69 | Temp 98.3°F | Ht 69.0 in | Wt 214.0 lb

## 2019-07-27 DIAGNOSIS — R945 Abnormal results of liver function studies: Secondary | ICD-10-CM

## 2019-07-27 DIAGNOSIS — Z86010 Personal history of colonic polyps: Secondary | ICD-10-CM | POA: Diagnosis not present

## 2019-07-27 DIAGNOSIS — R7989 Other specified abnormal findings of blood chemistry: Secondary | ICD-10-CM

## 2019-07-27 NOTE — Patient Instructions (Signed)
If you are age 77 or older, your body mass index should be between 23-30. Your Body mass index is 31.6 kg/m. If this is out of the aforementioned range listed, please consider follow up with your Primary Care Provider.  If you are age 4 or younger, your body mass index should be between 19-25. Your Body mass index is 31.6 kg/m. If this is out of the aformentioned range listed, please consider follow up with your Primary Care Provider.   It was a pleasure to see you today!  Dr. Loletha Carrow

## 2019-07-27 NOTE — Progress Notes (Signed)
[facility] Consult Note:  History: Andre Chen 07/27/2019  Referring provider: Sueanne Margarita, DO  Reason for consult/chief complaint: elevated lfts (pt reports no obvious GI symptoms)   Subjective  HPI: Recently referred to hematology and [place] by new PCP(Skakle) at [place] for elevated ferritin. Hematology consult with Dr. Irene Limbo on 07/21/2019, no visit note has yet been entered, additional labs were ordered and referral sent to [place]. Mr. Laflash was accompanied by his wife, who assists with the history.  His previous PCP, Dr. Levin Erp, had apparently been following elevated LFTs for year as well as elevated ferritin and gave a diagnosis of hemochromatosis.  Mr. Erlinger had not previously had a hematology evaluation prior to the recent visit noted above.  He has not previously had genetic testing nor phlebotomy.  No known history of hemochromatosis.  He feels well, denies abdominal pain or rectal bleeding.  Occasional bloating, and in the last year has regular BMs but not sure he always gets as well evacuated as he needs to. Denies heartburn, dysphagia, nausea, vomiting, early satiety or weight loss.  Last colonoscopy by Dr. Deatra Ina May 2015, diminutive adenoma removed.  Regionally 5-year recall, though with new guidelines, patient was sent a letter last year recommending no surveillance colonoscopy.  ROS:  Review of Systems  Constitutional: Negative for appetite change and unexpected weight change.  HENT: Negative for mouth sores and voice change.   Eyes: Negative for pain and redness.  Respiratory: Negative for cough and shortness of breath.   Cardiovascular: Negative for chest pain and palpitations.  Genitourinary: Negative for dysuria and hematuria.  Musculoskeletal: Positive for arthralgias. Negative for myalgias.  Skin: Negative for pallor and rash.  Neurological: Negative for weakness and headaches.  Hematological: Negative for adenopathy.    Gout  Past Medical History: Past Medical History:  Diagnosis Date  . Cataracts, bilateral   . Colon polyps   . Gout   . Hyperlipemia   . Hypertension   . Obesity      Past Surgical History: Past Surgical History:  Procedure Laterality Date  . APPENDECTOMY  1957  . CATARACT EXTRACTION W/ INTRAOCULAR LENS  IMPLANT, BILATERAL  1/15 and 2/15  . COLONOSCOPY  2004     Family History: Family History  Problem Relation Age of Onset  . Colon cancer Neg Hx   . Esophageal cancer Neg Hx   . Rectal cancer Neg Hx   . Stomach cancer Neg Hx     Social History: Social History   Socioeconomic History  . Marital status: Married    Spouse name: Not on file  . Number of children: Not on file  . Years of education: Not on file  . Highest education level: Not on file  Occupational History  . Not on file  Tobacco Use  . Smoking status: Former Smoker    Quit date: 05/13/1972    Years since quitting: 47.2  . Smokeless tobacco: Former Systems developer    Quit date: 01/22/2000  Substance and Sexual Activity  . Alcohol use: No  . Drug use: No  . Sexual activity: Not on file  Other Topics Concern  . Not on file  Social History Narrative  . Not on file   Social Determinants of Health   Financial Resource Strain:   . Difficulty of Paying Living Expenses:   Food Insecurity:   . Worried About Charity fundraiser in the Last Year:   . [place] in the Last  Year:   Transportation Needs:   . Film/video editor (Medical):   Marland Kitchen Lack of Transportation (Non-Medical):   Physical Activity:   . Days of Exercise per Week:   . Minutes of Exercise per Session:   Stress:   . Feeling of Stress :   Social Connections:   . Frequency of Communication with Friends and Family:   . Frequency of Social Gatherings with Friends and Family:   . Attends Religious Services:   . Active Member of Clubs or Organizations:   . Attends Archivist Meetings:   Marland Kitchen Marital Status:     Allergies: No  Known Allergies  Outpatient Meds: Current Outpatient Medications  Medication Sig Dispense Refill  . atenolol-chlorthalidone (TENORETIC) 100-25 MG per tablet Take 1 tablet by mouth daily.    . colchicine 0.6 MG tablet Take 0.6 mg by mouth daily.    Marland Kitchen doxazosin (CARDURA) 2 MG tablet Take 2 mg by mouth at bedtime.    Marland Kitchen etodolac (LODINE) 400 MG tablet Take 400 mg by mouth as needed.     . Febuxostat (ULORIC) 80 MG TABS Take by mouth daily.    . valsartan (DIOVAN) 320 MG tablet Take 320 mg by mouth daily.     No current facility-administered medications for this visit.      ___________________________________________________________________ Objective   Exam:  BP 140/70   Pulse 69   Temp 98.3 F (36.8 C)   Ht 5\' 9"  (1.753 m)   Wt 214 lb (97.1 kg)   BMI 31.60 kg/m    General: Well-appearing  Eyes: sclera anicteric, no redness  ENT: oral mucosa moist without lesions, no cervical or supraclavicular lymphadenopathy  CV: RRR without murmur, S1/S2, no JVD, no peripheral edema  Resp: clear to auscultation bilaterally, normal RR and effort noted  GI: soft, no tenderness, with active bowel sounds. No guarding or palpable organomegaly noted.  No fluid wave or shifting dullness, no spleen tip palpable, no caput medusae.  Skin; warm and dry, no rash or jaundice noted.  No spider nevi  Neuro: awake, alert and oriented x 3. Normal gross motor function and fluent speech.  Steady gait, no asterixis  Labs:  CBC Latest Ref Rng & Units 07/21/2019  WBC 4.0 - 10.5 K/uL 6.7  Hemoglobin 13.0 - 17.0 g/dL 13.6  Hematocrit 39.0 - 52.0 % 40.3  Platelets 150 - 400 K/uL 126(L)   Iron/TIBC/Ferritin/ %Sat    Component Value Date/Time   IRON 123 07/21/2019 1107   TIBC 296 07/21/2019 1107   FERRITIN 1,227 (H) 07/21/2019 1107   IRONPCTSAT 41 07/21/2019 1107   CMP Latest Ref Rng & Units 07/21/2019 02/21/2016  Glucose 70 - 99 mg/dL 286(H) -  BUN 8 - 23 mg/dL 41(H) -  Creatinine 0.61 - 1.24  mg/dL 1.84(H) 1.82(H)  Sodium 135 - 145 mmol/L 140 -  Potassium 3.5 - 5.1 mmol/L 5.1 -  Chloride 98 - 111 mmol/L 105 -  CO2 22 - 32 mmol/L 27 -  Calcium 8.9 - 10.3 mg/dL 9.6 -  Total Protein 6.5 - 8.1 g/dL 6.9 -  Total Bilirubin 0.3 - 1.2 mg/dL 0.6 -  Alkaline Phos 38 - 126 U/L 88 -  AST 15 - 41 U/L 39 -  ALT 0 - 44 U/L 86(H) -   Hepatitis B surface antigen and total core antibody negative, hepatitis C antibody negative  Hemochromatosis gene mutation ordered by hematology and pending __________________________________________________________________________ Radiologic Studies:  CLINICAL DATA:  Hemochromatosis.   EXAM: ABDOMEN  ULTRASOUND COMPLETE   COMPARISON:  MRI abdomen dated December 30, 2016.   FINDINGS: Gallbladder: No gallstones or wall thickening visualized. No sonographic Murphy sign noted by sonographer.   Common bile duct: Diameter: 3 mm, normal.   Liver: No focal lesion identified. Heterogeneous, slightly increased parenchymal echogenicity. Portal vein is patent on color Doppler imaging with normal direction of blood flow towards the liver.   IVC: Obscured by bowel gas.   Pancreas: Obscured by bowel gas.   Spleen: Size and appearance within normal limits.   Right Kidney: Length: 10.0 cm. Echogenicity within normal limits. No mass or hydronephrosis visualized.   Left Kidney: Length: 11.7 cm. Echogenicity within normal limits. No mass or hydronephrosis visualized.   Abdominal aorta: Obscured by bowel gas.   Other findings: None.   IMPRESSION: 1. Known renal cysts are not well visualized by ultrasound. 2. Mildly heterogeneous liver may reflect underlying liver disease.     Electronically Signed   By: Titus Dubin M.D.   On: 12/29/2017 09:58  ___________________________________________  Dr. Irene Limbo ordered a right upper quadrant ultrasound with elastography, scheduled for tomorrow  Assessment: Encounter Diagnoses  Name Primary?  Marland Kitchen LFTs abnormal  Yes  . Hemochromatosis, unspecified hemochromatosis type   . Personal history of colonic polyps     Mild ALT predominant transaminitis with markedly elevated ferritin and 40% saturation.  Probable hereditary hemochromatosis, genetic testing pending.  Hematology note not yet available for review, but Mr. Gauna and his wife recall the conversation at that visit discussing probable need for phlebotomy. I believe the elevated ALT is related to the underlying iron overload.  Low platelets of unclear duration and cause.  In this setting, raises concern for portal hypertension, though no obvious exam evidence of that. Ultrasound and elastography pending for tomorrow.  If significant fibrosis discovered, will need EGD to screen for varices. He has not been getting regular ultrasounds to screen for hepatoma in the setting of hemochromatosis, but will need that going forward.  We discussed all that, and we will arrange regular follow-up.  Updated polyp surveillance guidelines indicate he does not need to undergo colonoscopy at his age with previous colonoscopy findings.  Thank you for the courtesy of this consult.  Please call me with any questions or concerns.  Nelida Meuse III  CC: Referring provider noted above

## 2019-07-28 ENCOUNTER — Ambulatory Visit (HOSPITAL_COMMUNITY)
Admission: RE | Admit: 2019-07-28 | Discharge: 2019-07-28 | Disposition: A | Discharge status: Home / Self Care | Payer: PPO | Source: Ambulatory Visit | Attending: Hematology | Admitting: Hematology

## 2019-07-28 ENCOUNTER — Other Ambulatory Visit: Payer: Self-pay

## 2019-07-28 ENCOUNTER — Telehealth: Payer: Self-pay | Admitting: Hematology

## 2019-07-28 DIAGNOSIS — R945 Abnormal results of liver function studies: Secondary | ICD-10-CM | POA: Diagnosis not present

## 2019-07-28 DIAGNOSIS — K7689 Other specified diseases of liver: Secondary | ICD-10-CM | POA: Diagnosis not present

## 2019-07-28 NOTE — Telephone Encounter (Signed)
Scheduled apt per 3/16 sch message -call got disconnected . Left message for pt with appt date and time

## 2019-08-09 NOTE — Progress Notes (Signed)
HEMATOLOGY/ONCOLOGY CONSULTATION NOTE  Date of Service: 08/10/2019  Patient Care Team: Andre Margarita, DO as PCP - General (Internal Medicine)  CHIEF COMPLAINTS/PURPOSE OF CONSULTATION:  Elevated Ferritin/Evaluation for Hemochromatosis  HISTORY OF PRESENTING ILLNESS:   Andre Chen is a wonderful 79 y.o. male who has been referred to [place] by Andre Chen for evaluation and management of elevated ferritin/evaluation for hemochromatosis. Pt is accompanied today by Andre Chen. The pt reports that he is doing well overall.   The pt currently follows with Andre. Sueanne Chen for primary care but used to see Andre. Carlota Chen prior to his retirement. The pt reports that he has no family history of hemachromatosis and no history of heart disease or liver failure. Pt has not historically been a heavy drinker. It has been three years since he has had any alcohol and when he was drinking it was under one pack of beer per year. He has never been on iron supplements but has a significant history of cooking meals in cast-iron skillets. Pt eats steaks about once per week. Pt denies ever receiving a blood transfusion. Pt is unable to give blood, himself, due to a Hepatitis infection when he was in 3rd grade. Pt does not believe that he was ever diagnosed with hemochromatosis but notes that Andre. Carlota Chen discussed that at one point the pt may need therapeutic phlebotomies.   He was first told that he was having some liver dysfunction about 8 years ago. There has been some concern that the liver dysfunction was caused by Colchicine, which pt has been taking for many years. Pt believes that his gout triggers include ice cream, pecans, fish and shrimp. He has an upcoming appointment with Andre. Wilfrid Lund on 03/16 for further w/o of liver dysfunction.    Pt has dry, flaking skin that comes off in large patches. The patches occur most often on his head and around the nasolabial folds of his face. Pt uses OTC cream for his skin  condition. He has never been diagnosed with Psoriasis and has declined previous Dermatology referrals. Pt has had BCC and SCC removed by Andre. Carlota Chen.   Of note prior to the patient's visit today, pt has had [place] Abd ([id number]) completed on 12/29/2017 with results revealing "1. Known renal cysts are not well visualized by ultrasound. 2. Mildly heterogeneous liver may reflect underlying liver disease."   Most recent lab results (06/25/2019) of CMP is as follows: all values are WNL except for Glucose at 208, BUN at 34, Creatinine at 1.7, ALT at 94. 06/25/2019 Ferritin at 1017.7 06/25/2019 Iron and TIBC is as follows: Iron at 166, Iron Sat at 64, TIBC at 261, UIBC at 95.  On review of systems, pt denies abdominal pain, leg swelling, fatigue and any other symptoms.   On PMHx the pt reports Gout, HTN, BPH, BCC, SCC. On Social Hx the pt reports that he rarely drinks alcohol.  INTERVAL HISTORY:  Andre Chen is a wonderful 79 y.o. male who is here for evaluation and management of elevated ferritin/evaluation for hemochromatosis. We are joined today by his wife, Andre Chen. The patient's last visit with [place] was on 07/21/2019. The pt reports that he is doing well overall.  The pt reports that he has never been diagnosed with Diabetes, but has had elevated fasting blood glucose levels in the past. He denies any new symptoms or abdominal pain.   Of note since the patient's last visit, pt has had [place] Abd ([id number]) completed on  07/28/2019 with results revealing "No evidence of cholelithiasis or biliary ductal dilatation. Diffuse hepatocellular disease, similar in appearance to prior study. No hepatic mass visualized.Diagnostic category:  < or = 5 kPa: high probability of being normal"  Lab results (07/21/19) of CBC w/diff and CMP is as follows: all values are WNL except for RBC at 4.09, PLT at 126K, Glucose at 286, BUN at 41, Creatinine at 1.84, ALT at 86, GFR Est Non Af Am at 35. 07/21/2019 Iron and TIBC  is as follows: Iron at 123, TIBC at 296, Sat Ratios at 41, UIBC at 174 07/21/2019 Ferritin at 1227 07/21/2019 Hep C Ab is "Non Reactive"  07/21/2019 Hep B Core Total Ab is "Non Reactive" 07/21/2019 Hep B Surface Ag is "Non Reactive" 07/21/2019 Hemochromatosis shows "Single Mutation Identified"  On review of systems, pt denies abdominal pain and any other symptoms.   MEDICAL HISTORY:  Past Medical History:  Diagnosis Date  . Cataracts, bilateral   . Colon polyps   . Gout   . Hyperlipemia   . Hypertension   . Obesity   Benign Prostatic Hyperplasia BCC  SCC   SURGICAL HISTORY: Past Surgical History:  Procedure Laterality Date  . APPENDECTOMY  1957  . CATARACT EXTRACTION W/ INTRAOCULAR LENS  IMPLANT, BILATERAL  1/15 and 2/15  . COLONOSCOPY  2004    SOCIAL HISTORY: Social History   Socioeconomic History  . Marital status: Married    Spouse name: Not on file  . Number of children: Not on file  . Years of education: Not on file  . Highest education level: Not on file  Occupational History  . Not on file  Tobacco Use  . Smoking status: Former Smoker    Quit date: 05/13/1972    Years since quitting: 47.2  . Smokeless tobacco: Former Systems developer    Quit date: 01/22/2000  Substance and Sexual Activity  . Alcohol use: No  . Drug use: No  . Sexual activity: Not on file  Other Topics Concern  . Not on file  Social History Narrative  . Not on file   Social Determinants of Health   Financial Resource Strain:   . Difficulty of Paying Living Expenses:   Food Insecurity:   . Worried About Charity fundraiser in the Last Year:   . Arboriculturist in the Last Year:   Transportation Needs:   . Film/video editor (Medical):   Marland Kitchen Lack of Transportation (Non-Medical):   Physical Activity:   . Days of Exercise per Week:   . Minutes of Exercise per Session:   Stress:   . Feeling of Stress :   Social Connections:   . Frequency of Communication with Friends and Family:   .  Frequency of Social Gatherings with Friends and Family:   . Attends Religious Services:   . Active Member of Clubs or Organizations:   . Attends Archivist Meetings:   Marland Kitchen Marital Status:   Intimate Partner Violence:   . Fear of Current or Ex-Partner:   . Emotionally Abused:   Marland Kitchen Physically Abused:   . Sexually Abused:     FAMILY HISTORY: Family History  Problem Relation Age of Onset  . Colon cancer Neg Hx   . Esophageal cancer Neg Hx   . Rectal cancer Neg Hx   . Stomach cancer Neg Hx     ALLERGIES:  has No Known Allergies.  MEDICATIONS:  Current Outpatient Medications  Medication Sig Dispense Refill  . atenolol-chlorthalidone (  TENORETIC) 100-25 MG per tablet Take 1 tablet by mouth daily.    . colchicine 0.6 MG tablet Take 0.6 mg by mouth every other day.     . doxazosin (CARDURA) 2 MG tablet Take 2 mg by mouth at bedtime.    Marland Kitchen etodolac (LODINE) 400 MG tablet Take 400 mg by mouth as needed.     . Febuxostat (ULORIC) 80 MG TABS Take 40 mg by mouth daily.     . valsartan (DIOVAN) 320 MG tablet Take 320 mg by mouth daily.     No current facility-administered medications for this visit.    REVIEW OF SYSTEMS:   A 10+ POINT REVIEW OF SYSTEMS WAS OBTAINED including neurology, dermatology, psychiatry, cardiac, respiratory, lymph, extremities, GI, GU, Musculoskeletal, constitutional, breasts, reproductive, HEENT.  All pertinent positives are noted in the HPI.  All others are negative.   PHYSICAL EXAMINATION: ECOG PERFORMANCE STATUS: 0 - Asymptomatic  . Vitals:   08/10/19 0851  BP: (!) 160/51  Pulse: 61  Resp: 18  Temp: 98.3 F (36.8 C)  SpO2: 99%   Filed Weights   08/10/19 0851  Weight: 212 lb 6.4 oz (96.3 kg)   .Body mass index is 31.37 kg/m.   GENERAL:alert, in no acute distress and comfortable SKIN: no acute rashes, no significant lesions EYES: conjunctiva are pink and non-injected, sclera anicteric OROPHARYNX: MMM, no exudates, no oropharyngeal erythema  or ulceration NECK: supple, no JVD LYMPH:  no palpable lymphadenopathy in the cervical, axillary or inguinal regions LUNGS: clear to auscultation b/l with normal respiratory effort HEART: regular rate & rhythm ABDOMEN:  normoactive bowel sounds , non tender, not distended. No palpable hepatosplenomegaly.  Extremity: no pedal edema PSYCH: alert & oriented x 3 with fluent speech NEURO: no focal motor/sensory deficits  LABORATORY DATA:  I have reviewed the data as listed  . CBC Latest Ref Rng & Units 07/21/2019  WBC 4.0 - 10.5 K/uL 6.7  Hemoglobin 13.0 - 17.0 g/dL 13.6  Hematocrit 39.0 - 52.0 % 40.3  Platelets 150 - 400 K/uL 126(L)    . CMP Latest Ref Rng & Units 07/21/2019 02/21/2016  Glucose 70 - 99 mg/dL 286(H) -  BUN 8 - 23 mg/dL 41(H) -  Creatinine 0.61 - 1.24 mg/dL 1.84(H) 1.82(H)  Sodium 135 - 145 mmol/L 140 -  Potassium 3.5 - 5.1 mmol/L 5.1 -  Chloride 98 - 111 mmol/L 105 -  CO2 22 - 32 mmol/L 27 -  Calcium 8.9 - 10.3 mg/dL 9.6 -  Total Protein 6.5 - 8.1 g/dL 6.9 -  Total Bilirubin 0.3 - 1.2 mg/dL 0.6 -  Alkaline Phos 38 - 126 U/L 88 -  AST 15 - 41 U/L 39 -  ALT 0 - 44 U/L 86(H) -    07/21/2019:   RADIOGRAPHIC STUDIES: I have personally reviewed the radiological images as listed and agreed with the findings in the report. US ABDOMEN RUQ W/ELASTOGRAPHY  Result Date: 07/28/2019 CLINICAL DATA:  Elevated liver function tests. Iron overload and hemosiderosis. EXAM: US ABDOMEN LIMITED - RIGHT UPPER QUADRANT ULTRASOUND HEPATIC ELASTOGRAPHY TECHNIQUE: Sonography of the right upper quadrant was performed. In addition, ultrasound elastography evaluation of the liver was performed. A region of interest was placed within the right lobe of the liver. Following application of a compressive sonographic pulse, tissue compressibility was assessed. Multiple assessments were performed at the selected site. Median tissue compressibility was determined. Previously, hepatic stiffness was  assessed by shear wave velocity. Based on recently published Society of Radiologists in  Ultrasound consensus article, reporting is now recommended to be performed in the SI units of pressure (kiloPascals) representing hepatic stiffness/elasticity. The obtained result is compared to the published reference standards. (cACLD = compensated Advanced Chronic Liver Disease) COMPARISON:  12/29/2017 FINDINGS: ULTRASOUND ABDOMEN LIMITED RIGHT UPPER QUADRANT Gallbladder: No gallstones or wall thickening visualized. No sonographic Murphy sign noted. Common bile duct: Diameter: 3 mm, within normal limits. Liver: Diffusely increased and coarsened hepatic echotexture is similar to prior exam, consistent with diffuse hepatocellular disease. No focal liver masses identified. Portal vein is patent on color Doppler imaging with normal direction of blood flow towards the liver. ULTRASOUND HEPATIC ELASTOGRAPHY Device: Siemens Helix VTQ Patient position: Oblique Transducer 5C1 Number of measurements: 10 Hepatic segment:  8 Median kPa: 4.8 IQR: 1.2 IQR/Median kPa ratio: 0.25 Data quality:  Good Diagnostic category:  < or = 5 kPa: high probability of being normal IMPRESSION: ULTRASOUND RUQ: No evidence of cholelithiasis or biliary ductal dilatation. Diffuse hepatocellular disease, similar in appearance to prior study. No hepatic mass visualized. ULTRASOUND HEPATIC ELASTOGRAPHY: Median kPa:  4.8 Diagnostic category:  < or = 5 kPa: high probability of being normal The use of hepatic elastography is applicable to patients with viral hepatitis and non-alcoholic fatty liver disease. At this time, there is insufficient data for the referenced cut-off values and use in other causes of liver disease, including alcoholic liver disease. Patients, however, may be assessed by elastography and serve as their own reference standard/baseline. In patients with non-alcoholic liver disease, the values suggesting compensated advanced chronic liver disease  (cACLD) may be lower, and patients may need additional testing with elasticity results of 7-9 kPa. Please note that abnormal hepatic elasticity and shear wave velocities may also be identified in clinical settings other than with hepatic fibrosis, such as: acute hepatitis, elevated right heart and central venous pressures including use of beta blockers, veno-occlusive disease (Budd-Chiari), infiltrative processes such as mastocytosis/amyloidosis/infiltrative tumor/lymphoma, extrahepatic cholestasis, with hyperemia in the post-prandial state, and with liver transplantation. Correlation with patient history, laboratory data, and clinical condition recommended. Diagnostic Categories: < or =5 kPa: high probability of being normal < or =9 kPa: in the absence of other known clinical signs, rules out cACLD >9 kPa and ?13 kPa: suggestive of cACLD, but needs further testing >13 kPa: highly suggestive of cACLD > or =17 kPa: highly suggestive of cACLD with an increased probability of clinically significant portal hypertension Electronically Signed   By: Marlaine Hind M.D.   On: 07/28/2019 10:01    ASSESSMENT & PLAN:   37 y/omale with   1) Elevated ferritin levels concerning for hereditary hemochromatosis vs secondary hemosiderosis from chronic liver disease.  12/29/2017 [place] Abd ([id number]) revealed "1. Known renal cysts are not well visualized by ultrasound. 2. Mildly heterogeneous liver may reflect underlying liver disease."  PLAN: -Discussed pt labwork, 07/21/19; all values are WNL except for RBC at 4.09, PLT at 126K, Glucose at 286, BUN at 41, Creatinine at 1.84, ALT at 86, GFR Est Non Af Am at 35. -Discussed 07/21/2019 Ferritin is greatly elevated at 1227 -Discussed 07/21/2019 Iron and TIBC is as follows: Iron at 123, TIBC at 296, Sat Ratios at 41, UIBC at 174 -Discussed 07/21/2019 Hep C Ab, Hep B Core Total Ab, Hep B Surface Ag are all normal  -Discussed 07/21/2019 Hemochromatosis shows "Single Mutation  Identified (H63D)" -Discussed 07/28/2019 [place] Abd ([id number]) which revealed "No evidence of cholelithiasis or biliary ductal dilatation. Diffuse hepatocellular disease, similar in appearance to prior study. No hepatic mass  visualized." -Recommended that the pt continue to eat well, drink at least 48-64 oz of water each day, and walk 20-30 minutes each day.  -Advised pt that his mutation is not a the most aggressive form and his heterozygous state would make this presentation more mild -Advised pt that that his children have a 50% chance of being a carrier (barring his wife having a related mutation)  - would recommend pt discuss this with his children -Advised pt that his iron stores have increased slowly overtime -Advised pt that when Ferritin >1000 it increases the chance of liver, heart, or other organ injury  -Would recommend we begin therapeutic phlebotomies to reach goal Ferritin =< 100 -Will give a therapeutic phlebotomy today and continue every other week until goal -Recommend pt f/u with PCP for elevated blood glucose levels -Will see back in 2 months with labs   FOLLOW UP: Therapeutic phlebotomy every 2 weeks x 6 with labs RTC with Andre Irene Limbo in 8 weeks   The total time spent in the appt was 25 minutes and more than 50% was on counseling and direct patient cares.  All of the patient's questions were answered with apparent satisfaction. The patient knows to call the clinic with any problems, questions or concerns.    Sullivan Lone MD Drew AAHIVMS [facility] Hematology/Oncology Physician [facility]  (Office):       [phone] (Work cell):  [phone] (Fax):           [phone]  08/10/2019 9:35 AM   I, Yevette Edwards, am acting as a scribe for Andre. Sullivan Lone.   .I have reviewed the above documentation for accuracy and completeness, and I agree with the above. Brunetta Genera MD

## 2019-08-10 ENCOUNTER — Other Ambulatory Visit: Payer: Self-pay

## 2019-08-10 ENCOUNTER — Inpatient Hospital Stay: Payer: PPO

## 2019-08-10 ENCOUNTER — Telehealth: Payer: Self-pay | Admitting: Hematology

## 2019-08-10 ENCOUNTER — Inpatient Hospital Stay (HOSPITAL_BASED_OUTPATIENT_CLINIC_OR_DEPARTMENT_OTHER): Payer: PPO | Admitting: Hematology

## 2019-08-10 ENCOUNTER — Other Ambulatory Visit: Payer: Self-pay | Admitting: *Deleted

## 2019-08-10 DIAGNOSIS — R945 Abnormal results of liver function studies: Secondary | ICD-10-CM | POA: Diagnosis not present

## 2019-08-10 DIAGNOSIS — N281 Cyst of kidney, acquired: Secondary | ICD-10-CM | POA: Diagnosis not present

## 2019-08-10 DIAGNOSIS — R7989 Other specified abnormal findings of blood chemistry: Secondary | ICD-10-CM | POA: Diagnosis not present

## 2019-08-10 NOTE — Telephone Encounter (Signed)
Scheduled appts per 3/30 los. Gave pt a print out of AVS.

## 2019-08-10 NOTE — Patient Instructions (Signed)
Therapeutic Phlebotomy, Care After This sheet gives you information about how to care for yourself after your procedure. Your health care provider may also give you more specific instructions. If you have problems or questions, contact your health care provider. What can I expect after the procedure? After the procedure, it is common to have:  Light-headedness or dizziness. You may feel faint.  Nausea.  Tiredness (fatigue). Follow these instructions at home: Eating and drinking  Be sure to eat well-balanced meals for the next 24 hours.  Drink enough fluid to keep your urine pale yellow.  Avoid drinking alcohol on the day that you had the procedure. Activity   Return to your normal activities as told by your health care provider. Most people can go back to their normal activities right away.  Avoid activities that take a lot of effort for about 5 hours after the procedure. Athletes should avoid strenuous exercise for at least 12 hours.  Avoid heavy lifting or pulling for about 5 hours after the procedure. Do not lift anything that is heavier than 10 lb (4.5 kg).  Change positions slowly for the remainder of the day. This will help to prevent light-headedness or fainting.  If you feel light-headed, lie down until the feeling goes away. Needle insertion site care   Keep your bandage (dressing) dry. You can remove the bandage after about 5 hours or as told by your health care provider.  If you have bleeding from the needle insertion site, raise (elevate) your arm and press firmly on the site until the bleeding stops.  If you have bruising at the site, apply ice to the area: ? Remove the dressing. ? Put ice in a plastic bag. ? Place a towel between your skin and the bag. ? Leave the ice on for 20 minutes, 2-3 times a day for the first 24 hours.  If the swelling does not go away after 24 hours, apply a warm, moist cloth (warm compress) to the area for 20 minutes, 2-3 times a day.  General instructions  Do not use any products that contain nicotine or tobacco, such as cigarettes and e-cigarettes, for at least 30 minutes after the procedure.  Keep all follow-up visits as told by your health care provider. This is important. You may need to continue having regular therapeutic phlebotomy treatments as directed. Contact a health care provider if you:  Have redness, swelling, or pain at the needle insertion site.  Have fluid or blood coming from the needle insertion site.  Have pus or a bad smell coming from the needle insertion site.  Notice that the needle insertion site feels warm to the touch.  Feel light-headed, dizzy, or nauseous, and the feeling does not go away.  Have new bruising at the needle insertion site.  Feel weaker than normal.  Have a fever or chills. Get help right away if:  You faint.  You have chest pain.  You have trouble breathing.  You have severe nausea or vomiting. Summary  After the procedure, it is common to have some light-headedness, dizziness, nausea, or tiredness (fatigue).  Be sure to eat well-balanced meals for the next 24 hours. Drink enough fluid to keep your urine pale yellow.  Return to your normal activities as told by your health care provider.  Keep all follow-up visits as told by your health care provider. You may need to continue having regular therapeutic phlebotomy treatments as directed. This information is not intended to replace advice given to you   by your health care provider. Make sure you discuss any questions you have with your health care provider. Document Revised: 05/16/2017 Document Reviewed: 05/15/2017 Elsevier Patient Education  2020 Elsevier Inc.    Therapeutic Phlebotomy Therapeutic phlebotomy is the planned removal of blood from a person's body for the purpose of treating a medical condition. The procedure is similar to donating blood. Usually, about a pint (470 mL, or 0.47 L) of blood  is removed. The average adult has 9-12 pints (4.3-5.7 L) of blood in the body. Therapeutic phlebotomy may be used to treat the following medical conditions:  Hemochromatosis. This is a condition in which the blood contains too much iron.  Polycythemia vera. This is a condition in which the blood contains too many red blood cells.  Porphyria cutanea tarda. This is a disease in which an important part of hemoglobin is not made properly. It results in the buildup of abnormal amounts of porphyrins in the body.  Sickle cell disease. This is a condition in which the red blood cells form an abnormal crescent shape rather than a round shape. Tell a health care provider about:  Any allergies you have.  All medicines you are taking, including vitamins, herbs, eye drops, creams, and over-the-counter medicines.  Any problems you or family members have had with anesthetic medicines.  Any blood disorders you have.  Any surgeries you have had.  Any medical conditions you have.  Whether you are pregnant or may be pregnant. What are the risks? Generally, this is a safe procedure. However, problems may occur, including:  Nausea or light-headedness.  Low blood pressure (hypotension).  Soreness, bleeding, swelling, or bruising at the needle insertion site.  Infection. What happens before the procedure?  Follow instructions from your health care provider about eating or drinking restrictions.  Ask your health care provider about: ? Changing or stopping your regular medicines. This is especially important if you are taking diabetes medicines or blood thinners (anticoagulants). ? Taking medicines such as aspirin and ibuprofen. These medicines can thin your blood. Do not take these medicines unless your health care provider tells you to take them. ? Taking over-the-counter medicines, vitamins, herbs, and supplements.  Wear clothing with sleeves that can be raised above the elbow.  Plan to have  someone take you home from the hospital or clinic.  You may have a blood sample taken.  Your blood pressure, pulse rate, and breathing rate will be measured. What happens during the procedure?   To lower your risk of infection: ? Your health care team will wash or sanitize their hands. ? Your skin will be cleaned with an antiseptic.  You may be given a medicine to numb the area (local anesthetic).  A tourniquet will be placed on your arm.  A needle will be inserted into one of your veins.  Tubing and a collection bag will be attached to that needle.  Blood will flow through the needle and tubing into the collection bag.  The collection bag will be placed lower than your arm to allow gravity to help the flow of blood into the bag.  You may be asked to open and close your hand slowly and continually during the entire collection.  After the specified amount of blood has been removed from your body, the collection bag and tubing will be clamped.  The needle will be removed from your vein.  Pressure will be held on the site of the needle insertion to stop the bleeding.  A bandage (  dressing) will be placed over the needle insertion site. The procedure may vary among health care providers and hospitals. What happens after the procedure?  Your blood pressure, pulse rate, and breathing rate will be measured after the procedure.  You will be encouraged to drink fluids.  Your recovery will be assessed and monitored.  You can return to your normal activities as told by your health care provider. Summary  Therapeutic phlebotomy is the planned removal of blood from a person's body for the purpose of treating a medical condition.  Therapeutic phlebotomy may be used to treat hemochromatosis, polycythemia vera, porphyria cutanea tarda, or sickle cell disease.  In the procedure, a needle is inserted and about a pint (470 mL, or 0.47 L) of blood is removed. The average adult has 9-12  pints (4.3-5.7 L) of blood in the body.  This is generally a safe procedure, but it can sometimes cause problems such as nausea, light-headedness, or low blood pressure (hypotension). This information is not intended to replace advice given to you by your health care provider. Make sure you discuss any questions you have with your health care provider. Document Revised: 05/15/2017 Document Reviewed: 05/15/2017 Elsevier Patient Education  2020 Elsevier Inc.  

## 2019-08-10 NOTE — Progress Notes (Signed)
Andre Chen presents today for phlebotomy per MD orders. 16 gauge phlebotomy kit used for LAC. Phlebotomy procedure started at 1010 and ended at 1018. 540 grams removed. Needle removed intact. Patient observed for 30 minutes after procedure without any incident. Patient tolerated procedure well. Food and drink offered, drink accepted. VSS.

## 2019-08-16 DIAGNOSIS — N1831 Chronic kidney disease, stage 3a: Secondary | ICD-10-CM | POA: Diagnosis not present

## 2019-08-16 DIAGNOSIS — E1169 Type 2 diabetes mellitus with other specified complication: Secondary | ICD-10-CM | POA: Diagnosis not present

## 2019-08-16 DIAGNOSIS — R7889 Finding of other specified substances, not normally found in blood: Secondary | ICD-10-CM | POA: Diagnosis not present

## 2019-08-16 DIAGNOSIS — R7989 Other specified abnormal findings of blood chemistry: Secondary | ICD-10-CM | POA: Diagnosis not present

## 2019-08-23 ENCOUNTER — Other Ambulatory Visit: Payer: Self-pay | Admitting: *Deleted

## 2019-08-23 DIAGNOSIS — R300 Dysuria: Secondary | ICD-10-CM | POA: Diagnosis not present

## 2019-08-23 DIAGNOSIS — N1831 Chronic kidney disease, stage 3a: Secondary | ICD-10-CM | POA: Diagnosis not present

## 2019-08-23 DIAGNOSIS — K5900 Constipation, unspecified: Secondary | ICD-10-CM | POA: Diagnosis not present

## 2019-08-23 DIAGNOSIS — E1169 Type 2 diabetes mellitus with other specified complication: Secondary | ICD-10-CM | POA: Diagnosis not present

## 2019-08-23 DIAGNOSIS — N390 Urinary tract infection, site not specified: Secondary | ICD-10-CM | POA: Diagnosis not present

## 2019-08-23 DIAGNOSIS — R110 Nausea: Secondary | ICD-10-CM | POA: Diagnosis not present

## 2019-08-24 ENCOUNTER — Inpatient Hospital Stay: Payer: PPO | Attending: Hematology

## 2019-08-24 ENCOUNTER — Telehealth: Payer: Self-pay | Admitting: *Deleted

## 2019-08-24 ENCOUNTER — Other Ambulatory Visit: Payer: Self-pay | Admitting: Hematology

## 2019-08-24 ENCOUNTER — Telehealth: Payer: Self-pay

## 2019-08-24 ENCOUNTER — Inpatient Hospital Stay: Payer: PPO

## 2019-08-24 ENCOUNTER — Other Ambulatory Visit: Payer: Self-pay

## 2019-08-24 DIAGNOSIS — E860 Dehydration: Secondary | ICD-10-CM

## 2019-08-24 DIAGNOSIS — N1831 Chronic kidney disease, stage 3a: Secondary | ICD-10-CM | POA: Diagnosis not present

## 2019-08-24 DIAGNOSIS — M109 Gout, unspecified: Secondary | ICD-10-CM | POA: Diagnosis not present

## 2019-08-24 DIAGNOSIS — K5900 Constipation, unspecified: Secondary | ICD-10-CM | POA: Diagnosis not present

## 2019-08-24 DIAGNOSIS — R7989 Other specified abnormal findings of blood chemistry: Secondary | ICD-10-CM | POA: Diagnosis not present

## 2019-08-24 DIAGNOSIS — E1169 Type 2 diabetes mellitus with other specified complication: Secondary | ICD-10-CM | POA: Diagnosis not present

## 2019-08-24 DIAGNOSIS — N390 Urinary tract infection, site not specified: Secondary | ICD-10-CM | POA: Diagnosis not present

## 2019-08-24 DIAGNOSIS — G2581 Restless legs syndrome: Secondary | ICD-10-CM | POA: Diagnosis not present

## 2019-08-24 DIAGNOSIS — R739 Hyperglycemia, unspecified: Secondary | ICD-10-CM | POA: Diagnosis not present

## 2019-08-24 DIAGNOSIS — N179 Acute kidney failure, unspecified: Secondary | ICD-10-CM | POA: Diagnosis not present

## 2019-08-24 DIAGNOSIS — I10 Essential (primary) hypertension: Secondary | ICD-10-CM | POA: Diagnosis not present

## 2019-08-24 LAB — CMP (CANCER CENTER ONLY)
ALT: 38 U/L (ref 0–44)
AST: 17 U/L (ref 15–41)
Albumin: 3.2 g/dL — ABNORMAL LOW (ref 3.5–5.0)
Alkaline Phosphatase: 103 U/L (ref 38–126)
Anion gap: 13 (ref 5–15)
BUN: 55 mg/dL — ABNORMAL HIGH (ref 8–23)
CO2: 25 mmol/L (ref 22–32)
Calcium: 9.7 mg/dL (ref 8.9–10.3)
Chloride: 90 mmol/L — ABNORMAL LOW (ref 98–111)
Creatinine: 2.73 mg/dL — ABNORMAL HIGH (ref 0.61–1.24)
GFR, Est AFR Am: 25 mL/min — ABNORMAL LOW (ref >60)
GFR, Estimated: 22 mL/min — ABNORMAL LOW (ref >60)
Glucose, Bld: 555 mg/dL (ref 70–99)
Potassium: 4.1 mmol/L (ref 3.5–5.1)
Sodium: 128 mmol/L — ABNORMAL LOW (ref 135–145)
Total Bilirubin: 0.8 mg/dL (ref 0.3–1.2)
Total Protein: 6.9 g/dL (ref 6.5–8.1)

## 2019-08-24 LAB — CBC WITH DIFFERENTIAL (CANCER CENTER ONLY)
Abs Immature Granulocytes: 0.06 10*3/uL (ref 0.00–0.07)
Basophils Absolute: 0 10*3/uL (ref 0.0–0.1)
Basophils Relative: 0 %
Eosinophils Absolute: 0 10*3/uL (ref 0.0–0.5)
Eosinophils Relative: 0 %
HCT: 37.1 % — ABNORMAL LOW (ref 39.0–52.0)
Hemoglobin: 12.7 g/dL — ABNORMAL LOW (ref 13.0–17.0)
Immature Granulocytes: 1 %
Lymphocytes Relative: 7 %
Lymphs Abs: 0.8 10*3/uL (ref 0.7–4.0)
MCH: 33 pg (ref 26.0–34.0)
MCHC: 34.2 g/dL (ref 30.0–36.0)
MCV: 96.4 fL (ref 80.0–100.0)
Monocytes Absolute: 1.3 10*3/uL — ABNORMAL HIGH (ref 0.1–1.0)
Monocytes Relative: 11 %
Neutro Abs: 9 10*3/uL — ABNORMAL HIGH (ref 1.7–7.7)
Neutrophils Relative %: 81 %
Platelet Count: 162 10*3/uL (ref 150–400)
RBC: 3.85 MIL/uL — ABNORMAL LOW (ref 4.22–5.81)
RDW: 11.5 % (ref 11.5–15.5)
WBC Count: 11.2 10*3/uL — ABNORMAL HIGH (ref 4.0–10.5)
nRBC: 0 % (ref 0.0–0.2)

## 2019-08-24 LAB — IRON AND TIBC
Iron: 28 ug/dL — ABNORMAL LOW (ref 42–163)
Saturation Ratios: 12 % — ABNORMAL LOW (ref 20–55)
TIBC: 239 ug/dL (ref 202–409)
UIBC: 211 ug/dL (ref 117–376)

## 2019-08-24 LAB — FERRITIN: Ferritin: 2872 ng/mL — ABNORMAL HIGH (ref 24–336)

## 2019-08-24 MED ORDER — SODIUM CHLORIDE 0.9 % IV SOLN
Freq: Once | INTRAVENOUS | Status: AC
  Filled 2019-08-24: qty 250

## 2019-08-24 MED ORDER — SODIUM CHLORIDE 0.9 % IV SOLN
INTRAVENOUS | Status: DC
  Filled 2019-08-24: qty 250

## 2019-08-24 NOTE — Progress Notes (Addendum)
Aneta Mins presents today for phlebotomy per MD orders. Phlebotomy procedure started at 0940 with a 16G in left [facility] and ended at 0950. At 0950, pt began to feel "light-headed and dizzy". Pt's legs elevated, phlebotomy stopped, cold pack applied to back of neck. See VS in flowsheets.  487 grams removed. IV needle removed intact.  Per MD Irene Limbo, IVF administered over 1 hour (see eMAR). VSS (see flowsheets). Pt reports "feeling better". Will continue to monitor. Kathlynn Grate, RN to come talk to pt in infusion room about his glucose and sodium today.   Per MD Irene Limbo, CBG check at 11:31 with result of 458, Kathlynn Grate, RN notified. Pt discharged per MD Irene Limbo to go straight to his PCP appt with his wife.

## 2019-08-24 NOTE — Patient Instructions (Signed)
Therapeutic Phlebotomy, Care After This sheet gives you information about how to care for yourself after your procedure. Your health care provider may also give you more specific instructions. If you have problems or questions, contact your health care provider. What can I expect after the procedure? After the procedure, it is common to have:  Light-headedness or dizziness. You may feel faint.  Nausea.  Tiredness (fatigue). Follow these instructions at home: Eating and drinking  Be sure to eat well-balanced meals for the next 24 hours.  Drink enough fluid to keep your urine pale yellow.  Avoid drinking alcohol on the day that you had the procedure. Activity   Return to your normal activities as told by your health care provider. Most people can go back to their normal activities right away.  Avoid activities that take a lot of effort for about 5 hours after the procedure. Athletes should avoid strenuous exercise for at least 12 hours.  Avoid heavy lifting or pulling for about 5 hours after the procedure. Do not lift anything that is heavier than 10 lb (4.5 kg).  Change positions slowly for the remainder of the day. This will help to prevent light-headedness or fainting.  If you feel light-headed, lie down until the feeling goes away. Needle insertion site care   Keep your bandage (dressing) dry. You can remove the bandage after about 5 hours or as told by your health care provider.  If you have bleeding from the needle insertion site, raise (elevate) your arm and press firmly on the site until the bleeding stops.  If you have bruising at the site, apply ice to the area: ? Remove the dressing. ? Put ice in a plastic bag. ? Place a towel between your skin and the bag. ? Leave the ice on for 20 minutes, 2-3 times a day for the first 24 hours.  If the swelling does not go away after 24 hours, apply a warm, moist cloth (warm compress) to the area for 20 minutes, 2-3 times a  day. General instructions  Do not use any products that contain nicotine or tobacco, such as cigarettes and e-cigarettes, for at least 30 minutes after the procedure.  Keep all follow-up visits as told by your health care provider. This is important. You may need to continue having regular therapeutic phlebotomy treatments as directed. Contact a health care provider if you:  Have redness, swelling, or pain at the needle insertion site.  Have fluid or blood coming from the needle insertion site.  Have pus or a bad smell coming from the needle insertion site.  Notice that the needle insertion site feels warm to the touch.  Feel light-headed, dizzy, or nauseous, and the feeling does not go away.  Have new bruising at the needle insertion site.  Feel weaker than normal.  Have a fever or chills. Get help right away if:  You faint.  You have chest pain.  You have trouble breathing.  You have severe nausea or vomiting. Summary  After the procedure, it is common to have some light-headedness, dizziness, nausea, or tiredness (fatigue).  Be sure to eat well-balanced meals for the next 24 hours. Drink enough fluid to keep your urine pale yellow.  Return to your normal activities as told by your health care provider.  Keep all follow-up visits as told by your health care provider. You may need to continue having regular therapeutic phlebotomy treatments as directed. This information is not intended to replace advice given to you   by your health care provider. Make sure you discuss any questions you have with your health care provider. Document Revised: 05/16/2017 Document Reviewed: 05/15/2017 Elsevier Patient Education  [place].   Dehydration, Adult Dehydration is a condition in which there is not enough water or other fluids in the body. This happens when a person loses more fluids than he or she takes in. Important organs, such as the kidneys, brain, and heart, cannot  function without a proper amount of fluids. Any loss of fluids from the body can lead to dehydration. Dehydration can be mild, moderate, or severe. It should be treated right away to prevent it from becoming severe. What are the causes? Dehydration may be caused by:  Conditions that cause loss of water or other fluids, such as diarrhea, vomiting, or sweating or urinating a lot.  Not drinking enough fluids, especially when you are ill or doing activities that require a lot of energy.  Other illnesses and conditions, such as fever or infection.  Certain medicines, such as medicines that remove excess fluid from the body (diuretics).  Lack of safe drinking water.  Not being able to get enough water and food. What increases the risk? The following factors may make you more likely to develop this condition:  Having a long-term (chronic) illness that has not been treated properly, such as diabetes, heart disease, or kidney disease.  Being 82 years of age or older.  Having a disability.  Living in a place that is high in altitude, where thinner, drier air causes more fluid loss.  Doing exercises that put stress on your body for a long time (endurance sports). What are the signs or symptoms? Symptoms of dehydration depend on how severe it is. Mild or moderate dehydration  Thirst.  Dry lips or dry mouth.  Dizziness or light-headedness, especially when standing up from a seated position.  Muscle cramps.  Dark urine. Urine may be the color of tea.  Less urine or tears produced than usual.  Headache. Severe dehydration  Changes in skin. Your skin may be cold and clammy, blotchy, or pale. Your skin also may not return to normal after being lightly pinched and released.  Little or no tears, urine, or sweat.  Changes in vital signs, such as rapid breathing and low blood pressure. Your pulse may be weak or may be faster than 100 beats a minute when you are sitting still.  Other  changes, such as: ? Feeling very thirsty. ? Sunken eyes. ? Cold hands and feet. ? Confusion. ? Being very tired (lethargic) or having trouble waking from sleep. ? Short-term weight loss. ? Loss of consciousness. How is this diagnosed? This condition is diagnosed based on your symptoms and a physical exam. You may have blood and urine tests to help confirm the diagnosis. How is this treated? Treatment for this condition depends on how severe it is. Treatment should be started right away. Do not wait until dehydration becomes severe. Severe dehydration is an emergency and needs to be treated in a hospital.  Mild or moderate dehydration can be treated at home. You may be asked to: ? Drink more fluids. ? Drink an oral rehydration solution (ORS). This drink helps restore proper amounts of fluids and salts and minerals in the blood (electrolytes).  Severe dehydration can be treated: ? With IV fluids. ? By correcting abnormal levels of electrolytes. This is often done by giving electrolytes through a tube that is passed through your nose and into your  stomach (nasogastric tube, or NG tube). ? By treating the underlying cause of dehydration. Follow these instructions at home: Oral rehydration solution If told by your health care provider, drink an ORS:  Make an ORS by following instructions on the package.  Start by drinking small amounts, about  cup (120 mL) every 5-10 minutes.  Slowly increase how much you drink until you have taken the amount recommended by your health care provider. Eating and drinking         Drink enough clear fluid to keep your urine pale yellow. If you were told to drink an ORS, finish the ORS first and then start slowly drinking other clear fluids. Drink fluids such as: ? Water. Do not drink only water. Doing that can lead to hyponatremia, which is having too little salt (sodium) in the body. ? Water from ice chips you suck on. ? Fruit juice that you have  added water to (diluted fruit juice). ? Low-calorie sports drinks.  Eat foods that contain a healthy balance of electrolytes, such as bananas, oranges, potatoes, tomatoes, and spinach.  Do not drink alcohol.  Avoid the following: ? Drinks that contain a lot of sugar. These include high-calorie sports drinks, fruit juice that is not diluted, and soda. ? Caffeine. ? Foods that are greasy or contain a lot of fat or sugar. General instructions  Take over-the-counter and prescription medicines only as told by your health care provider.  Do not take sodium tablets. Doing that can lead to having too much sodium in the body (hypernatremia).  Return to your normal activities as told by your health care provider. Ask your health care provider what activities are safe for you.  Keep all follow-up visits as told by your health care provider. This is important. Contact a health care provider if:  You have muscle cramps, pain, or discomfort, such as: ? Pain in your abdomen and the pain gets worse or stays in one area (localizes). ? Stiff neck.  You have a rash.  You are more irritable than usual.  You are sleepier or have a harder time waking than usual.  You feel weak or dizzy.  You feel very thirsty. Get help right away if you have:  Any symptoms of severe dehydration.  Symptoms of vomiting, such as: ? You cannot eat or drink without vomiting. ? Vomiting gets worse or does not go away. ? Vomit includes blood or green matter (bile).  Symptoms that get worse with treatment.  A fever.  A severe headache.  Problems with urination or bowel movements, such as: ? Diarrhea that gets worse or does not go away. ? Blood in your stool (feces). This may cause stool to look black and tarry. ? Not urinating, or urinating only a small amount of very dark urine, within 6-8 hours.  Trouble breathing. These symptoms may represent a serious problem that is an emergency. Do not wait to see if  the symptoms will go away. Get medical help right away. Call your local emergency services (911 in the [place] Do not drive yourself to the hospital. Summary  Dehydration is a condition in which there is not enough water or other fluids in the body. This happens when a person loses more fluids than he or she takes in.  Treatment for this condition depends on how severe it is. Treatment should be started right away. Do not wait until dehydration becomes severe.  Drink enough clear fluid to keep your urine pale yellow. If you  were told to drink an oral rehydration solution (ORS), finish the ORS first and then start slowly drinking other clear fluids.  Take over-the-counter and prescription medicines only as told by your health care provider.  Get help right away if you have any symptoms of severe dehydration. This information is not intended to replace advice given to you by your health care provider. Make sure you discuss any questions you have with your health care provider. Document Revised: 12/10/2018 Document Reviewed: 12/10/2018 Elsevier Patient Education  [place].

## 2019-08-24 NOTE — Telephone Encounter (Signed)
Critical Value: Glucose 555  Gwinda Maine , RN notified

## 2019-08-24 NOTE — Telephone Encounter (Signed)
Late entry 0915 - Dr.Kale notified of elevated blood glucose, 0950 - orders received

## 2019-08-25 LAB — GLUCOSE, CAPILLARY: Glucose-Capillary: 458 mg/dL — ABNORMAL HIGH (ref 70–99)

## 2019-09-02 DIAGNOSIS — E1169 Type 2 diabetes mellitus with other specified complication: Secondary | ICD-10-CM | POA: Diagnosis not present

## 2019-09-07 ENCOUNTER — Inpatient Hospital Stay: Payer: PPO

## 2019-09-07 ENCOUNTER — Other Ambulatory Visit: Payer: Self-pay | Admitting: *Deleted

## 2019-09-07 ENCOUNTER — Other Ambulatory Visit: Payer: Self-pay

## 2019-09-07 DIAGNOSIS — R7989 Other specified abnormal findings of blood chemistry: Secondary | ICD-10-CM | POA: Diagnosis not present

## 2019-09-07 LAB — CMP (CANCER CENTER ONLY)
ALT: 56 U/L — ABNORMAL HIGH (ref 0–44)
AST: 34 U/L (ref 15–41)
Albumin: 3.5 g/dL (ref 3.5–5.0)
Alkaline Phosphatase: 74 U/L (ref 38–126)
Anion gap: 9 (ref 5–15)
BUN: 28 mg/dL — ABNORMAL HIGH (ref 8–23)
CO2: 25 mmol/L (ref 22–32)
Calcium: 9.4 mg/dL (ref 8.9–10.3)
Chloride: 103 mmol/L (ref 98–111)
Creatinine: 1.78 mg/dL — ABNORMAL HIGH (ref 0.61–1.24)
GFR, Est AFR Am: 42 mL/min — ABNORMAL LOW (ref >60)
GFR, Estimated: 36 mL/min — ABNORMAL LOW (ref >60)
Glucose, Bld: 196 mg/dL — ABNORMAL HIGH (ref 70–99)
Potassium: 4.8 mmol/L (ref 3.5–5.1)
Sodium: 137 mmol/L (ref 135–145)
Total Bilirubin: 0.5 mg/dL (ref 0.3–1.2)
Total Protein: 6.7 g/dL (ref 6.5–8.1)

## 2019-09-07 LAB — CBC WITH DIFFERENTIAL (CANCER CENTER ONLY)
Abs Immature Granulocytes: 0.03 10*3/uL (ref 0.00–0.07)
Basophils Absolute: 0 10*3/uL (ref 0.0–0.1)
Basophils Relative: 1 %
Eosinophils Absolute: 0.1 10*3/uL (ref 0.0–0.5)
Eosinophils Relative: 2 %
HCT: 34.6 % — ABNORMAL LOW (ref 39.0–52.0)
Hemoglobin: 11.3 g/dL — ABNORMAL LOW (ref 13.0–17.0)
Immature Granulocytes: 0 %
Lymphocytes Relative: 14 %
Lymphs Abs: 1.1 10*3/uL (ref 0.7–4.0)
MCH: 33 pg (ref 26.0–34.0)
MCHC: 32.7 g/dL (ref 30.0–36.0)
MCV: 101.2 fL — ABNORMAL HIGH (ref 80.0–100.0)
Monocytes Absolute: 0.5 10*3/uL (ref 0.1–1.0)
Monocytes Relative: 7 %
Neutro Abs: 6.2 10*3/uL (ref 1.7–7.7)
Neutrophils Relative %: 76 %
Platelet Count: 223 10*3/uL (ref 150–400)
RBC: 3.42 MIL/uL — ABNORMAL LOW (ref 4.22–5.81)
RDW: 12 % (ref 11.5–15.5)
WBC Count: 8 10*3/uL (ref 4.0–10.5)
nRBC: 0 % (ref 0.0–0.2)

## 2019-09-07 NOTE — Patient Instructions (Signed)
Therapeutic Phlebotomy, Care After This sheet gives you information about how to care for yourself after your procedure. Your health care provider may also give you more specific instructions. If you have problems or questions, contact your health care provider. What can I expect after the procedure? After the procedure, it is common to have:  Light-headedness or dizziness. You may feel faint.  Nausea.  Tiredness (fatigue). Follow these instructions at home: Eating and drinking  Be sure to eat well-balanced meals for the next 24 hours.  Drink enough fluid to keep your urine pale yellow.  Avoid drinking alcohol on the day that you had the procedure. Activity   Return to your normal activities as told by your health care provider. Most people can go back to their normal activities right away.  Avoid activities that take a lot of effort for about 5 hours after the procedure. Athletes should avoid strenuous exercise for at least 12 hours.  Avoid heavy lifting or pulling for about 5 hours after the procedure. Do not lift anything that is heavier than 10 lb (4.5 kg).  Change positions slowly for the remainder of the day. This will help to prevent light-headedness or fainting.  If you feel light-headed, lie down until the feeling goes away. Needle insertion site care   Keep your bandage (dressing) dry. You can remove the bandage after about 5 hours or as told by your health care provider.  If you have bleeding from the needle insertion site, raise (elevate) your arm and press firmly on the site until the bleeding stops.  If you have bruising at the site, apply ice to the area: ? Remove the dressing. ? Put ice in a plastic bag. ? Place a towel between your skin and the bag. ? Leave the ice on for 20 minutes, 2-3 times a day for the first 24 hours.  If the swelling does not go away after 24 hours, apply a warm, moist cloth (warm compress) to the area for 20 minutes, 2-3 times a  day. General instructions  Do not use any products that contain nicotine or tobacco, such as cigarettes and e-cigarettes, for at least 30 minutes after the procedure.  Keep all follow-up visits as told by your health care provider. This is important. You may need to continue having regular therapeutic phlebotomy treatments as directed. Contact a health care provider if you:  Have redness, swelling, or pain at the needle insertion site.  Have fluid or blood coming from the needle insertion site.  Have pus or a bad smell coming from the needle insertion site.  Notice that the needle insertion site feels warm to the touch.  Feel light-headed, dizzy, or nauseous, and the feeling does not go away.  Have new bruising at the needle insertion site.  Feel weaker than normal.  Have a fever or chills. Get help right away if:  You faint.  You have chest pain.  You have trouble breathing.  You have severe nausea or vomiting. Summary  After the procedure, it is common to have some light-headedness, dizziness, nausea, or tiredness (fatigue).  Be sure to eat well-balanced meals for the next 24 hours. Drink enough fluid to keep your urine pale yellow.  Return to your normal activities as told by your health care provider.  Keep all follow-up visits as told by your health care provider. You may need to continue having regular therapeutic phlebotomy treatments as directed. This information is not intended to replace advice given to you   by your health care provider. Make sure you discuss any questions you have with your health care provider. Document Revised: 05/16/2017 Document Reviewed: 05/15/2017 Elsevier Patient Education  2020 Elsevier Inc.  Coronavirus (COVID-19) Are you at risk?  Are you at risk for the Coronavirus (COVID-19)?  To be considered HIGH RISK for Coronavirus (COVID-19), you have to meet the following criteria:  . Traveled to [place], [place], [place], [place] or [place];  or in the [place] to [place], [place], [place], or [place]; and have fever, cough, and shortness of breath within the last 2 weeks of travel OR . Been in close contact with a person diagnosed with COVID-19 within the last 2 weeks and have fever, cough, and shortness of breath . IF YOU DO NOT MEET THESE CRITERIA, YOU ARE CONSIDERED LOW RISK FOR COVID-19.  What to do if you are HIGH RISK for COVID-19?  . If you are having a medical emergency, call 911. . Seek medical care right away. Before you go to a doctor's office, urgent care or emergency department, call ahead and tell them about your recent travel, contact with someone diagnosed with COVID-19, and your symptoms. You should receive instructions from your physician's office regarding next steps of care.  . When you arrive at healthcare provider, tell the healthcare staff immediately you have returned from visiting [place], [place], [place], [place] or [place]; or traveled in the [place] to [place], [place], [place], or [place]; in the last two weeks or you have been in close contact with a person diagnosed with COVID-19 in the last 2 weeks.   . Tell the health care staff about your symptoms: fever, cough and shortness of breath. . After you have been seen by a medical provider, you will be either: o Tested for (COVID-19) and discharged home on quarantine except to seek medical care if symptoms worsen, and asked to  - Stay home and avoid contact with others until you get your results (4-5 days)  - Avoid travel on public transportation if possible (such as bus, train, or airplane) or o Sent to the Emergency Department by EMS for evaluation, COVID-19 testing, and possible admission depending on your condition and test results.  What to do if you are LOW RISK for COVID-19?  Reduce your risk of any infection by using the same precautions used for avoiding the common cold or flu:  . Wash your hands often with soap and  warm water for at least 20 seconds.  If soap and water are not readily available, use an alcohol-based hand sanitizer with at least 60% alcohol.  . If coughing or sneezing, cover your mouth and nose by coughing or sneezing into the elbow areas of your shirt or coat, into a tissue or into your sleeve (not your hands). . Avoid shaking hands with others and consider head nods or verbal greetings only. . Avoid touching your eyes, nose, or mouth with unwashed hands.  . Avoid close contact with people who are sick. . Avoid places or events with large numbers of people in one location, like concerts or sporting events. . Carefully consider travel plans you have or are making. . If you are planning any travel outside or inside the [place], visit the CDC's Travelers' Health webpage for the latest health notices. . If you have some symptoms but not all symptoms, continue to monitor at home and seek medical attention if your symptoms worsen. . If you are having a medical   emergency, call 911.   ADDITIONAL HEALTHCARE OPTIONS FOR PATIENTS  [facility] / e-Visit: [email or web address]         [facility]: [phone]  [facility]: [phone]                   [facility]: [phone]  

## 2019-09-07 NOTE — Progress Notes (Signed)
Andre Chen presents today for phlebotomy per MD orders. Phlebotomy procedure started at 09:45 am and ended at 09:53 529 cc removed. Patient tolerated procedure well. IV needle removed intact.  Food and fluids offered, pt. given fluids. Pt here for 30 minutes post observation.

## 2019-09-14 DIAGNOSIS — E1169 Type 2 diabetes mellitus with other specified complication: Secondary | ICD-10-CM | POA: Diagnosis not present

## 2019-09-14 DIAGNOSIS — M109 Gout, unspecified: Secondary | ICD-10-CM | POA: Diagnosis not present

## 2019-09-14 DIAGNOSIS — G2581 Restless legs syndrome: Secondary | ICD-10-CM | POA: Diagnosis not present

## 2019-09-14 DIAGNOSIS — R7989 Other specified abnormal findings of blood chemistry: Secondary | ICD-10-CM | POA: Diagnosis not present

## 2019-09-14 DIAGNOSIS — N1831 Chronic kidney disease, stage 3a: Secondary | ICD-10-CM | POA: Diagnosis not present

## 2019-09-20 ENCOUNTER — Other Ambulatory Visit: Payer: Self-pay | Admitting: *Deleted

## 2019-09-21 ENCOUNTER — Encounter (INDEPENDENT_AMBULATORY_CARE_PROVIDER_SITE_OTHER): Payer: Self-pay

## 2019-09-21 ENCOUNTER — Inpatient Hospital Stay: Payer: PPO | Attending: Hematology

## 2019-09-21 ENCOUNTER — Other Ambulatory Visit: Payer: Self-pay

## 2019-09-21 ENCOUNTER — Inpatient Hospital Stay: Payer: PPO

## 2019-09-21 ENCOUNTER — Inpatient Hospital Stay (HOSPITAL_BASED_OUTPATIENT_CLINIC_OR_DEPARTMENT_OTHER): Payer: PPO | Admitting: Medical

## 2019-09-21 LAB — CMP (CANCER CENTER ONLY)
ALT: 34 U/L (ref 0–44)
AST: 25 U/L (ref 15–41)
Albumin: 3.6 g/dL (ref 3.5–5.0)
Alkaline Phosphatase: 65 U/L (ref 38–126)
Anion gap: 9 (ref 5–15)
BUN: 24 mg/dL — ABNORMAL HIGH (ref 8–23)
CO2: 25 mmol/L (ref 22–32)
Calcium: 9.4 mg/dL (ref 8.9–10.3)
Chloride: 107 mmol/L (ref 98–111)
Creatinine: 1.75 mg/dL — ABNORMAL HIGH (ref 0.61–1.24)
GFR, Est AFR Am: 43 mL/min — ABNORMAL LOW (ref >60)
GFR, Estimated: 37 mL/min — ABNORMAL LOW (ref >60)
Glucose, Bld: 122 mg/dL — ABNORMAL HIGH (ref 70–99)
Potassium: 4.5 mmol/L (ref 3.5–5.1)
Sodium: 141 mmol/L (ref 135–145)
Total Bilirubin: 0.4 mg/dL (ref 0.3–1.2)
Total Protein: 6.3 g/dL — ABNORMAL LOW (ref 6.5–8.1)

## 2019-09-21 LAB — CBC WITH DIFFERENTIAL (CANCER CENTER ONLY)
Abs Immature Granulocytes: 0.03 10*3/uL (ref 0.00–0.07)
Basophils Absolute: 0 10*3/uL (ref 0.0–0.1)
Basophils Relative: 1 %
Eosinophils Absolute: 0.4 10*3/uL (ref 0.0–0.5)
Eosinophils Relative: 7 %
HCT: 32.2 % — ABNORMAL LOW (ref 39.0–52.0)
Hemoglobin: 10.3 g/dL — ABNORMAL LOW (ref 13.0–17.0)
Immature Granulocytes: 1 %
Lymphocytes Relative: 20 %
Lymphs Abs: 1.1 10*3/uL (ref 0.7–4.0)
MCH: 33 pg (ref 26.0–34.0)
MCHC: 32 g/dL (ref 30.0–36.0)
MCV: 103.2 fL — ABNORMAL HIGH (ref 80.0–100.0)
Monocytes Absolute: 0.4 10*3/uL (ref 0.1–1.0)
Monocytes Relative: 8 %
Neutro Abs: 3.4 10*3/uL (ref 1.7–7.7)
Neutrophils Relative %: 63 %
Platelet Count: 168 10*3/uL (ref 150–400)
RBC: 3.12 MIL/uL — ABNORMAL LOW (ref 4.22–5.81)
RDW: 12.9 % (ref 11.5–15.5)
WBC Count: 5.4 10*3/uL (ref 4.0–10.5)
nRBC: 0 % (ref 0.0–0.2)

## 2019-09-21 LAB — FERRITIN: Ferritin: 527 ng/mL — ABNORMAL HIGH (ref 24–336)

## 2019-09-21 MED ORDER — SODIUM CHLORIDE 0.9 % IV SOLN
Freq: Once | INTRAVENOUS | Status: AC
  Filled 2019-09-21: qty 250

## 2019-09-21 NOTE — Patient Instructions (Signed)
Therapeutic Phlebotomy, Care After This sheet gives you information about how to care for yourself after your procedure. Your health care provider may also give you more specific instructions. If you have problems or questions, contact your health care provider. What can I expect after the procedure? After the procedure, it is common to have:  Light-headedness or dizziness. You may feel faint.  Nausea.  Tiredness (fatigue). Follow these instructions at home: Eating and drinking  Be sure to eat well-balanced meals for the next 24 hours.  Drink enough fluid to keep your urine pale yellow.  Avoid drinking alcohol on the day that you had the procedure. Activity   Return to your normal activities as told by your health care provider. Most people can go back to their normal activities right away.  Avoid activities that take a lot of effort for about 5 hours after the procedure. Athletes should avoid strenuous exercise for at least 12 hours.  Avoid heavy lifting or pulling for about 5 hours after the procedure. Do not lift anything that is heavier than 10 lb (4.5 kg).  Change positions slowly for the remainder of the day. This will help to prevent light-headedness or fainting.  If you feel light-headed, lie down until the feeling goes away. Needle insertion site care   Keep your bandage (dressing) dry. You can remove the bandage after about 5 hours or as told by your health care provider.  If you have bleeding from the needle insertion site, raise (elevate) your arm and press firmly on the site until the bleeding stops.  If you have bruising at the site, apply ice to the area: ? Remove the dressing. ? Put ice in a plastic bag. ? Place a towel between your skin and the bag. ? Leave the ice on for 20 minutes, 2-3 times a day for the first 24 hours.  If the swelling does not go away after 24 hours, apply a warm, moist cloth (warm compress) to the area for 20 minutes, 2-3 times a  day. General instructions  Do not use any products that contain nicotine or tobacco, such as cigarettes and e-cigarettes, for at least 30 minutes after the procedure.  Keep all follow-up visits as told by your health care provider. This is important. You may need to continue having regular therapeutic phlebotomy treatments as directed. Contact a health care provider if you:  Have redness, swelling, or pain at the needle insertion site.  Have fluid or blood coming from the needle insertion site.  Have pus or a bad smell coming from the needle insertion site.  Notice that the needle insertion site feels warm to the touch.  Feel light-headed, dizzy, or nauseous, and the feeling does not go away.  Have new bruising at the needle insertion site.  Feel weaker than normal.  Have a fever or chills. Get help right away if:  You faint.  You have chest pain.  You have trouble breathing.  You have severe nausea or vomiting. Summary  After the procedure, it is common to have some light-headedness, dizziness, nausea, or tiredness (fatigue).  Be sure to eat well-balanced meals for the next 24 hours. Drink enough fluid to keep your urine pale yellow.  Return to your normal activities as told by your health care provider.  Keep all follow-up visits as told by your health care provider. You may need to continue having regular therapeutic phlebotomy treatments as directed. This information is not intended to replace advice given to you   by your health care provider. Make sure you discuss any questions you have with your health care provider. Document Revised: 05/16/2017 Document Reviewed: 05/15/2017 Elsevier Patient Education  2020 [employer]  Coronavirus (COVID-19) Are you at risk?  Are you at risk for the Coronavirus (COVID-19)?  To be considered HIGH RISK for Coronavirus (COVID-19), you have to meet the following criteria:  . Traveled to [place], [place], [place], [place] or [place];  or in the [place] to [place], [place], [place], or [place]; and have fever, cough, and shortness of breath within the last 2 weeks of travel OR . Been in close contact with a person diagnosed with COVID-19 within the last 2 weeks and have fever, cough, and shortness of breath . IF YOU DO NOT MEET THESE CRITERIA, YOU ARE CONSIDERED LOW RISK FOR COVID-19.  What to do if you are HIGH RISK for COVID-19?  . If you are having a medical emergency, call 911. . Seek medical care right away. Before you go to a doctor's office, urgent care or emergency department, call ahead and tell them about your recent travel, contact with someone diagnosed with COVID-19, and your symptoms. You should receive instructions from your physician's office regarding next steps of care.  . When you arrive at healthcare provider, tell the healthcare staff immediately you have returned from visiting [place], [place], [place], [place] or [place]; or traveled in the [place] to [place], [place], [place], or [place]; in the last two weeks or you have been in close contact with a person diagnosed with COVID-19 in the last 2 weeks.   . Tell the health care staff about your symptoms: fever, cough and shortness of breath. . After you have been seen by a medical provider, you will be either: o Tested for (COVID-19) and discharged home on quarantine except to seek medical care if symptoms worsen, and asked to  - Stay home and avoid contact with others until you get your results (4-5 days)  - Avoid travel on public transportation if possible (such as bus, train, or airplane) or o Sent to the Emergency Department by EMS for evaluation, COVID-19 testing, and possible admission depending on your condition and test results.  What to do if you are LOW RISK for COVID-19?  Reduce your risk of any infection by using the same precautions used for avoiding the common cold or flu:  . Wash your hands often with soap and  warm water for at least 20 seconds.  If soap and water are not readily available, use an alcohol-based hand sanitizer with at least 60% alcohol.  . If coughing or sneezing, cover your mouth and nose by coughing or sneezing into the elbow areas of your shirt or coat, into a tissue or into your sleeve (not your hands). . Avoid shaking hands with others and consider head nods or verbal greetings only. . Avoid touching your eyes, nose, or mouth with unwashed hands.  . Avoid close contact with people who are sick. . Avoid places or events with large numbers of people in one location, like concerts or sporting events. . Carefully consider travel plans you have or are making. . If you are planning any travel outside or inside the [place], visit the CDC's Travelers' Health webpage for the latest health notices. . If you have some symptoms but not all symptoms, continue to monitor at home and seek medical attention if your symptoms worsen. . If you are having a medical   emergency, call 911.   ADDITIONAL HEALTHCARE OPTIONS FOR PATIENTS   Telehealth / e-Visit: [email or web address]         [facility]: [phone]  [facility]: [phone]                   [facility]: [phone]  

## 2019-09-21 NOTE — Progress Notes (Signed)
Andre Chen was seen in the infusion room today after he had a vagal response status post a therapeutic phlebotomy.  His blood pressure dropped significantly and he was bradycardic.  He was given a bolus of IV fluids to which he responded and was then able to be discharged home without any further issues of concern.  His vital signs normalized prior to him being released home.  Sandi Mealy, MHS, PA-C Physician Assistant

## 2019-09-21 NOTE — Progress Notes (Signed)
Patient presented today for phlebotomy. A 16 gauge needle was placed in the left AC and 530 ml of blood was removed.  The needle was removed and the catheter was fully intact.  Shortly after the patient became "lightheaded" and blood pressure dropped to 60/40.  The patient became pale and was starting to nod off.  Sandi Mealy was called to chair and ordered fluids.  See MAR. The patient began to feel better and color returned to his face. Patient was given food and beverage and was discharged in stable condition after fluids were finished.

## 2019-09-27 ENCOUNTER — Encounter: Payer: PPO | Attending: Internal Medicine | Admitting: Dietician

## 2019-09-27 ENCOUNTER — Encounter: Payer: Self-pay | Admitting: Dietician

## 2019-09-27 ENCOUNTER — Other Ambulatory Visit: Payer: Self-pay

## 2019-09-27 DIAGNOSIS — E119 Type 2 diabetes mellitus without complications: Secondary | ICD-10-CM | POA: Insufficient documentation

## 2019-09-27 NOTE — Patient Instructions (Signed)
Plan:  Great job on changes made! Aim for 3-4 Carb Choices per meal (45-60 grams) +/- 1 either way  Aim for 0-1 Carbs per snack if hungry  Include protein in moderation with your meals and snacks Consider reading food labels for Total Carbohydrate of foods Consider  increasing your activity level by walking for 30 minutes daily as tolerated Consider checking BG at alternate times per day  Consider taking medication as directed by MD

## 2019-09-27 NOTE — Progress Notes (Deleted)
Patient is here today with his wife.    History includes newly diagnosed diabetes, HTN, hemochromatosis, and CKD Labs noted to include A1C 12.5 per chart note 08/2019, eGFR 37 09/11/19 Weight 210 lbs today increased from 200 lbs.  He lost from 220 lbs prior to diabetes diagnosis. Medications include Lantus 18 units q HS.  Fasting blood glucose is now WNL.  Currently just checking once daily.  Patient lives with his wife.  She does the shopping and cooking.  He is a retired Museum/gallery curator Research scientist (medical)).

## 2019-10-02 NOTE — Progress Notes (Signed)
Diabetes Self-Management Education  Visit Type: First/Initial  Appt. Start Time: 1515 Appt. End Time: 1630  10/02/2019  [name], identified by name and date of birth, is a 79 y.o. male with a diagnosis of Diabetes: Type 2.   ASSESSMENT Patient is here today with his wife.  They would like information about nutrition to control his blood sugar.  His wife states that they have already made changes to decrease the iron in his diet with having red meat less often as well as stopped cooking with the cast iron skillet.  Suggested that they also avoid products fortified with additional iron.  Noted per chart that no further restrictions in diet are needed as therapeutic phlebotomy is the main treatment..  History includes newly diagnosed diabetes, HTN, hemochromatosis, gout and CKD Labs noted to include A1C 12.5% per chart note 08/2019, eGFR 37 09/11/19 Weight 210 lbs today increased from 200 lbs.  He lost from 220 lbs prior to diabetes diagnosis. Medications include Lantus 18 units q HS.  Fasting blood glucose is now WNL.  Currently just checking once daily.  Patient lives with his wife.  She does the shopping and cooking.  He is a retired Museum/gallery curator Research scientist (medical)).  Height _0  (1.753 m), weight 210 lb (95.3 kg). Body mass index is 31.01 kg/m.  DMSE note: Visit Type:  First/Initial Type 2 Diabetes diagnosed 08/16/2019 Currently following a diabetic meal plan His wife helps him with medical information. 8th grade education He is motivated to manage No preferred learning sytle Prediabetes education assessment:  Needs Instruction on all topics. Checks his blood sugar 1-2 times per day and current fasting blood glucose is 70-129 He has had 0 lows in the past month and 0 high blood glucose in the last week. He has an appointment for a diabetes eye exam. He has been to the dentist within the past year. He does not check his fee.  Usual meal intake: Breakfast:  1 strip bacon, 1 1/2 eggs, [employer]  toast, tomato, occasional fruit or grits OR oatmeal Snack:  Sugar free gelatin Lunch:  [place] or tomato or Chicken salad sandwich OR leftovers Snack:  Sugar free jello Dinner:  [place] meat loaf or BBQ or fish or grilled chicken with vegetables and occasional rice or potatoes OR pintos, cabbage, onions Snack:  Sugar free ice cream Beverages:  Bottled water, sugar free tea with sugar sub, 1% milk  Exercise:  Light walking.  States that he has not had the energy to exercise.  He has had no previous diabetes education.  Patient instructed on the definition of type 1 and 2 diabetes and discussed treatment options as well as factors that contribute to the development of diabetes. Discussed role of the main macronutrients  on blood glucose, label reading, portion sizes and meal options for improved blood sugar control. Reviewed medication, site rotation of insulin, and method of use. Discussed importance of physical activity as recommended by MD. Discussed monitoring, A!C and CBG goals, need for yearly eye exam and daily foot exam. Discussed treatment of hypo and hyperglycemia. Discussed chronic complications and how to avoid or limit Discussed role of stress on blood sugar.  Patient demonstrated understanding of information discussed.   Individualized Plan for Diabetes Self-Management Training:   Learning Objective:  Patient will have a greater understanding of diabetes self-management. Patient education plan is to attend individual and/or group sessions per assessed needs and concerns.   Plan:   Patient Instructions  Plan:  Doristine Dev[employer] job on changes  made! Aim for 3-4 Carb Choices per meal (45-60 grams) +/- 1 either way  Aim for 0-1 Carbs per snack if hungry  Include protein in moderation with your meals and snacks Consider reading food labels for Total Carbohydrate of foods Consider  increasing your activity level by walking for 30 minutes daily as tolerated Consider checking BG at  alternate times per day  Consider taking medication as directed by MD      Expected Outcomes:  Demonstrated interest in learning. Expect positive outcomes  Education material provided: ADA - How to Thrive: A Guide for Your Journey with Diabetes, Food label handouts, Meal plan card and Snack sheet  If problems or questions, patient to contact team via:  Phone and Email  Future DSME appointment: PRN

## 2019-10-04 ENCOUNTER — Other Ambulatory Visit: Payer: Self-pay | Admitting: *Deleted

## 2019-10-05 ENCOUNTER — Inpatient Hospital Stay: Payer: PPO | Admitting: Hematology

## 2019-10-05 ENCOUNTER — Telehealth: Payer: Self-pay | Admitting: Hematology

## 2019-10-05 ENCOUNTER — Other Ambulatory Visit: Payer: Self-pay

## 2019-10-05 ENCOUNTER — Inpatient Hospital Stay: Payer: PPO

## 2019-10-05 LAB — IRON AND TIBC
Iron: 69 ug/dL (ref 42–163)
Saturation Ratios: 22 % (ref 20–55)
TIBC: 316 ug/dL (ref 202–409)
UIBC: 247 ug/dL (ref 117–376)

## 2019-10-05 LAB — CBC WITH DIFFERENTIAL (CANCER CENTER ONLY)
Abs Immature Granulocytes: 0.02 10*3/uL (ref 0.00–0.07)
Basophils Absolute: 0.1 10*3/uL (ref 0.0–0.1)
Basophils Relative: 1 %
Eosinophils Absolute: 0.3 10*3/uL (ref 0.0–0.5)
Eosinophils Relative: 6 %
HCT: 32.8 % — ABNORMAL LOW (ref 39.0–52.0)
Hemoglobin: 10.5 g/dL — ABNORMAL LOW (ref 13.0–17.0)
Immature Granulocytes: 0 %
Lymphocytes Relative: 22 %
Lymphs Abs: 1.1 10*3/uL (ref 0.7–4.0)
MCH: 33.1 pg (ref 26.0–34.0)
MCHC: 32 g/dL (ref 30.0–36.0)
MCV: 103.5 fL — ABNORMAL HIGH (ref 80.0–100.0)
Monocytes Absolute: 0.4 10*3/uL (ref 0.1–1.0)
Monocytes Relative: 8 %
Neutro Abs: 3.2 10*3/uL (ref 1.7–7.7)
Neutrophils Relative %: 63 %
Platelet Count: 161 10*3/uL (ref 150–400)
RBC: 3.17 MIL/uL — ABNORMAL LOW (ref 4.22–5.81)
RDW: 13.2 % (ref 11.5–15.5)
WBC Count: 5.1 10*3/uL (ref 4.0–10.5)
nRBC: 0 % (ref 0.0–0.2)

## 2019-10-05 LAB — CMP (CANCER CENTER ONLY)
ALT: 33 U/L (ref 0–44)
AST: 26 U/L (ref 15–41)
Albumin: 3.8 g/dL (ref 3.5–5.0)
Alkaline Phosphatase: 64 U/L (ref 38–126)
Anion gap: 9 (ref 5–15)
BUN: 40 mg/dL — ABNORMAL HIGH (ref 8–23)
CO2: 23 mmol/L (ref 22–32)
Calcium: 9.6 mg/dL (ref 8.9–10.3)
Chloride: 109 mmol/L (ref 98–111)
Creatinine: 1.87 mg/dL — ABNORMAL HIGH (ref 0.61–1.24)
GFR, Est AFR Am: 40 mL/min — ABNORMAL LOW (ref >60)
GFR, Estimated: 34 mL/min — ABNORMAL LOW (ref >60)
Glucose, Bld: 124 mg/dL — ABNORMAL HIGH (ref 70–99)
Potassium: 4.9 mmol/L (ref 3.5–5.1)
Sodium: 141 mmol/L (ref 135–145)
Total Bilirubin: 0.3 mg/dL (ref 0.3–1.2)
Total Protein: 6.5 g/dL (ref 6.5–8.1)

## 2019-10-05 LAB — FERRITIN: Ferritin: 315 ng/mL (ref 24–336)

## 2019-10-05 NOTE — Patient Instructions (Signed)
Take 50 mg Losartan per day (1/2 pill) - down from 100 mg

## 2019-10-05 NOTE — Progress Notes (Signed)
HEMATOLOGY/ONCOLOGY CONSULTATION NOTE  Date of Service: 10/05/2019  Patient Care Team: Sueanne Margarita, DO as PCP - General (Internal Medicine)  CHIEF COMPLAINTS/PURPOSE OF CONSULTATION:  Mx of Hemochromatosis  HISTORY OF PRESENTING ILLNESS:   Andre Chen is a wonderful 79 y.o. male who has been referred to [place] by Dr Francesco Sor for evaluation and management of elevated ferritin/evaluation for hemochromatosis. Pt is accompanied today by Mrs. Edmister. The pt reports that he is doing well overall.   The pt currently follows with Dr. Sueanne Margarita for primary care but used to see Dr. Carlota Raspberry prior to his retirement. The pt reports that he has no family history of hemachromatosis and no history of heart disease or liver failure. Pt has not historically been a heavy drinker. It has been three years since he has had any alcohol and when he was drinking it was under one pack of beer per year. He has never been on iron supplements but has a significant history of cooking meals in cast-iron skillets. Pt eats steaks about once per week. Pt denies ever receiving a blood transfusion. Pt is unable to give blood, himself, due to a Hepatitis infection when he was in 3rd grade. Pt does not believe that he was ever diagnosed with hemochromatosis but notes that Dr. Carlota Raspberry discussed that at one point the pt may need therapeutic phlebotomies.   He was first told that he was having some liver dysfunction about 8 years ago. There has been some concern that the liver dysfunction was caused by Colchicine, which pt has been taking for many years. Pt believes that his gout triggers include ice cream, pecans, fish and shrimp. He has an upcoming appointment with Dr. Wilfrid Lund on 03/16 for further w/o of liver dysfunction.    Pt has dry, flaking skin that comes off in large patches. The patches occur most often on his head and around the nasolabial folds of his face. Pt uses OTC cream for his skin condition. He has never been  diagnosed with Psoriasis and has declined previous Dermatology referrals. Pt has had BCC and SCC removed by Dr. Carlota Raspberry.   Of note prior to the patient's visit today, pt has had [place] Abd ([id number]) completed on 12/29/2017 with results revealing "1. Known renal cysts are not well visualized by ultrasound. 2. Mildly heterogeneous liver may reflect underlying liver disease."   Most recent lab results (06/25/2019) of CMP is as follows: all values are WNL except for Glucose at 208, BUN at 34, Creatinine at 1.7, ALT at 94. 06/25/2019 Ferritin at 1017.7 06/25/2019 Iron and TIBC is as follows: Iron at 166, Iron Sat at 64, TIBC at 261, UIBC at 95.  On review of systems, pt denies abdominal pain, leg swelling, fatigue and any other symptoms.   On PMHx the pt reports Gout, HTN, BPH, BCC, SCC. On Social Hx the pt reports that he rarely drinks alcohol.  INTERVAL HISTORY:  Andre Chen is a wonderful 79 y.o. male who is here for evaluation and management of Iron overload/ hemochromatosis. The patient's last visit with [place] was on 08/10/2019. The pt reports that he is doing well overall.  The pt reports that a few weeks ago he ran to the bathroom, began feeling lightheaded and passed out. Pt fell, but did not sustain any significant injuries. He has otherwise been well and has no new concerns.   Pt has continued taking 100 mg Losartan and 4 mg Doxazosin daily. He has been drinking about 60  oz of water per day. He has also lost about 10 lbs since the beginning of the year.   Lab results today (10/05/19) of CBC w/diff and CMP is as follows: all values are WNL except for RBC at 3.17, Hgb at 10.5, HCT at 32.8, MCV at 103.5, Glucose at 124, BUN at 40, Creatinine at 1.87, GFR Est Non Af Am at 40. 10/05/2019 Iron Panel is as follows: Iron at 69, TIBC at 316, Sat Ratios at 22, UIBC at 247 10/05/2019 Ferritin at 315  On review of systems, pt denies abdominal pain, lightheadedness, dizziness and any other symptoms.    MEDICAL HISTORY:  Past Medical History:  Diagnosis Date  . Cataracts, bilateral   . Colon polyps   . Diabetes mellitus without complication ([place])   . Gout   . Hyperlipemia   . Hypertension   . Obesity   Benign Prostatic Hyperplasia BCC  SCC   SURGICAL HISTORY: Past Surgical History:  Procedure Laterality Date  . APPENDECTOMY  1957  . CATARACT EXTRACTION W/ INTRAOCULAR LENS  IMPLANT, BILATERAL  1/15 and 2/15  . COLONOSCOPY  2004    SOCIAL HISTORY: Social History   Socioeconomic History  . Marital status: Married    Spouse name: Not on file  . Number of children: Not on file  . Years of education: Not on file  . Highest education level: Not on file  Occupational History  . Not on file  Tobacco Use  . Smoking status: Former Smoker    Quit date: 05/13/1972    Years since quitting: 47.4  . Smokeless tobacco: Former Systems developer    Quit date: 01/22/2000  Substance and Sexual Activity  . Alcohol use: No  . Drug use: No  . Sexual activity: Not on file  Other Topics Concern  . Not on file  Social History Narrative  . Not on file   Social Determinants of Health   Financial Resource Strain:   . Difficulty of Paying Living Expenses:   Food Insecurity:   . Worried About Charity fundraiser in the Last Year:   . Arboriculturist in the Last Year:   Transportation Needs:   . Film/video editor (Medical):   Marland Kitchen Lack of Transportation (Non-Medical):   Physical Activity:   . Days of Exercise per Week:   . Minutes of Exercise per Session:   Stress:   . Feeling of Stress :   Social Connections:   . Frequency of Communication with Friends and Family:   . Frequency of Social Gatherings with Friends and Family:   . Attends Religious Services:   . Active Member of Clubs or Organizations:   . Attends Archivist Meetings:   Marland Kitchen Marital Status:   Intimate Partner Violence:   . Fear of Current or Ex-Partner:   . Emotionally Abused:   Marland Kitchen Physically Abused:   . Sexually  Abused:     FAMILY HISTORY: Family History  Problem Relation Age of Onset  . Colon cancer Neg Hx   . Esophageal cancer Neg Hx   . Rectal cancer Neg Hx   . Stomach cancer Neg Hx     ALLERGIES:  has No Known Allergies.  MEDICATIONS:  Current Outpatient Medications  Medication Sig Dispense Refill  . Alcohol Swabs ([facility] PREP) 70 % PADS Apply 1 each topically 3 (three) times daily.    Marland Kitchen atenolol-chlorthalidone (TENORETIC) 100-25 MG per tablet Take 1 tablet by mouth daily.    . colchicine 0.6  MG tablet Take 0.6 mg by mouth every other day.     . doxazosin (CARDURA) 4 MG tablet Take 4 mg by mouth daily.    Marland Kitchen etodolac (LODINE) 400 MG tablet Take 400 mg by mouth as needed.     . Febuxostat (ULORIC) 80 MG TABS Take 40 mg by mouth daily.     Marland Kitchen losartan (COZAAR) 100 MG tablet Take 50 mg by mouth daily.    Glory Rosebush Delica Lancets [id number] MISC Apply 1 each topically 3 (three) times daily.    Glory Rosebush VERIO test strip 1 each daily.    Marland Kitchen OZEMPIC, 0.25 OR 0.5 MG/DOSE, 2 MG/1.5ML SOPN     . polyethylene glycol (MIRALAX / GLYCOLAX) 17 g packet Take 17 g by mouth daily.     No current facility-administered medications for this visit.    REVIEW OF SYSTEMS:   A 10+ POINT REVIEW OF SYSTEMS WAS OBTAINED including neurology, dermatology, psychiatry, cardiac, respiratory, lymph, extremities, GI, GU, Musculoskeletal, constitutional, breasts, reproductive, HEENT.  All pertinent positives are noted in the HPI.  All others are negative.   PHYSICAL EXAMINATION: ECOG PERFORMANCE STATUS: 0 - Asymptomatic  . Vitals:   10/05/19 0905  BP: 97/76  Pulse: 66  Resp: 18  Temp: 98.7 F (37.1 C)  SpO2: 100%   Filed Weights   10/05/19 0905  Weight: 208 lb 8 oz (94.6 kg)   .Body mass index is 30.79 kg/m.   Exam was given in a chair   GENERAL:alert, in no acute distress and comfortable SKIN: no acute rashes, no significant lesions EYES: conjunctiva are pink and non-injected, sclera anicteric OROPHARYNX:  MMM, no exudates, no oropharyngeal erythema or ulceration NECK: supple, no JVD LYMPH:  no palpable lymphadenopathy in the cervical, axillary or inguinal regions LUNGS: clear to auscultation b/l with normal respiratory effort HEART: regular rate & rhythm ABDOMEN:  normoactive bowel sounds , non tender, not distended. No palpable hepatosplenomegaly.  Extremity: no pedal edema PSYCH: alert & oriented x 3 with fluent speech NEURO: no focal motor/sensory deficits  LABORATORY DATA:  I have reviewed the data as listed  . CBC Latest Ref Rng & Units 10/05/2019 09/21/2019 09/07/2019  WBC 4.0 - 10.5 K/uL 5.1 5.4 8.0  Hemoglobin 13.0 - 17.0 g/dL 10.5(L) 10.3(L) 11.3(L)  Hematocrit 39.0 - 52.0 % 32.8(L) 32.2(L) 34.6(L)  Platelets 150 - 400 K/uL 161 168 223    . CMP Latest Ref Rng & Units 10/05/2019 09/21/2019 09/07/2019  Glucose 70 - 99 mg/dL 124(H) 122(H) 196(H)  BUN 8 - 23 mg/dL 40(H) 24(H) 28(H)  Creatinine 0.61 - 1.24 mg/dL 1.87(H) 1.75(H) 1.78(H)  Sodium 135 - 145 mmol/L 141 141 137  Potassium 3.5 - 5.1 mmol/L 4.9 4.5 4.8  Chloride 98 - 111 mmol/L 109 107 103  CO2 22 - 32 mmol/L 23 25 25   Calcium 8.9 - 10.3 mg/dL 9.6 9.4 9.4  Total Protein 6.5 - 8.1 g/dL 6.5 6.3(L) 6.7  Total Bilirubin 0.3 - 1.2 mg/dL 0.3 0.4 0.5  Alkaline Phos 38 - 126 U/L 64 65 74  AST 15 - 41 U/L 26 25 34  ALT 0 - 44 U/L 33 34 56(H)   . Lab Results  Component Value Date   IRON 69 10/05/2019   TIBC 316 10/05/2019   IRONPCTSAT 22 10/05/2019   (Iron and TIBC)  Lab Results  Component Value Date   FERRITIN 315 10/05/2019     07/21/2019:   RADIOGRAPHIC STUDIES: I have personally reviewed the radiological images  as listed and agreed with the findings in the report. No results found.  ASSESSMENT & PLAN:   84 y/omale with   1) Elevated ferritin levels concerning for hereditary hemochromatosis vs secondary hemosiderosis from chronic liver disease.  12/29/2017 [place] Abd ([id number]) revealed "1. Known renal  cysts are not well visualized by ultrasound. 2. Mildly heterogeneous liver may reflect underlying liver disease."  07/21/2019 Hemochromatosis shows "Single Mutation Identified ([id number])" 07/28/2019 [place] Abd ([id number]) revealed "No evidence of cholelithiasis or biliary ductal dilatation. Diffuse hepatocellular disease, similar in appearance to prior study. No hepatic mass visualized."  PLAN: -Discussed pt labwork today, 10/05/19; some anemia that is improving, ALT has normalized, other blood counts and chemistries are stable  -Discussed 10/05/2019 Ferritin at 315 -Discussed 10/05/2019 Iron Panel-- 22% iron saturation -Advised pt that anemia is likely caused by phlebotomies and CKD -No indication for a therapeutic phlebotomy today. No risk of organ injury with current Ferritin levels and given CKD to avoid worsening would need to allow ferritin to run permissively a little higher. -Advised pt that Losartan and Doxazosin can contribute to lightheadedness, especially if he is not well-hydrated -Recommend pt monitor BP at home, prior to eating or activity. If BP remains low even after cutting Losartan dosage contact PCP immediately.  -Recommended that the pt continue to eat well, drink at least 48-64 oz of water each day, and walk 20-30 minutes each day.  -Recommend pt lower Losartan dosage to 50 mg per day -Recommend pt f/u with PCP within the next week -Will see back in 6 months with labs     FOLLOW UP: RTC with Dr Irene Limbo with labs in 6 months   The total time spent in the appt was 20 minutes and more than 50% was on counseling and direct patient cares.  All of the patient's questions were answered with apparent satisfaction. The patient knows to call the clinic with any problems, questions or concerns.    Sullivan Lone MD [place] AAHIVMS [facility] Hematology/Oncology Physician [facility]  (Office):       [phone] (Work cell):  [phone] (Fax):            [phone]  10/05/2019 11:07 AM   I, Yevette Edwards, am acting as a scribe for Dr. Sullivan Lone.   .I have reviewed the above documentation for accuracy and completeness, and I agree with the above. Brunetta Genera MD

## 2019-10-05 NOTE — Telephone Encounter (Signed)
Scheduled per 5/25 los. Printed AVS and calendar for pt. Cancelled appts in June per MD.

## 2019-10-14 DIAGNOSIS — K5900 Constipation, unspecified: Secondary | ICD-10-CM | POA: Diagnosis not present

## 2019-10-14 DIAGNOSIS — N1831 Chronic kidney disease, stage 3a: Secondary | ICD-10-CM | POA: Diagnosis not present

## 2019-10-14 DIAGNOSIS — E1169 Type 2 diabetes mellitus with other specified complication: Secondary | ICD-10-CM | POA: Diagnosis not present

## 2019-10-14 DIAGNOSIS — I10 Essential (primary) hypertension: Secondary | ICD-10-CM | POA: Diagnosis not present

## 2019-10-14 DIAGNOSIS — W19XXXD Unspecified fall, subsequent encounter: Secondary | ICD-10-CM | POA: Diagnosis not present

## 2019-10-19 ENCOUNTER — Other Ambulatory Visit: Payer: PPO

## 2019-10-19 ENCOUNTER — Ambulatory Visit: Payer: PPO | Admitting: Hematology

## 2019-10-19 ENCOUNTER — Ambulatory Visit: Payer: PPO

## 2019-11-02 ENCOUNTER — Other Ambulatory Visit: Payer: PPO

## 2020-01-26 DIAGNOSIS — M109 Gout, unspecified: Secondary | ICD-10-CM | POA: Diagnosis not present

## 2020-01-26 DIAGNOSIS — I129 Hypertensive chronic kidney disease with stage 1 through stage 4 chronic kidney disease, or unspecified chronic kidney disease: Secondary | ICD-10-CM | POA: Diagnosis not present

## 2020-01-26 DIAGNOSIS — N1831 Chronic kidney disease, stage 3a: Secondary | ICD-10-CM | POA: Diagnosis not present

## 2020-01-26 DIAGNOSIS — E1169 Type 2 diabetes mellitus with other specified complication: Secondary | ICD-10-CM | POA: Diagnosis not present

## 2020-03-16 IMAGING — US US ABDOMEN COMPLETE
1 series · 14 of 25 positions shown · non-contrast
Comparison: MRI abdomen dated December 30, 2016.

CLINICAL DATA: Hemochromatosis.

EXAM:
ABDOMEN ULTRASOUND COMPLETE

[Series 1: us abdomen complete · 0.20mm/px · 14 of 73 slices shown]
[im 1/73]
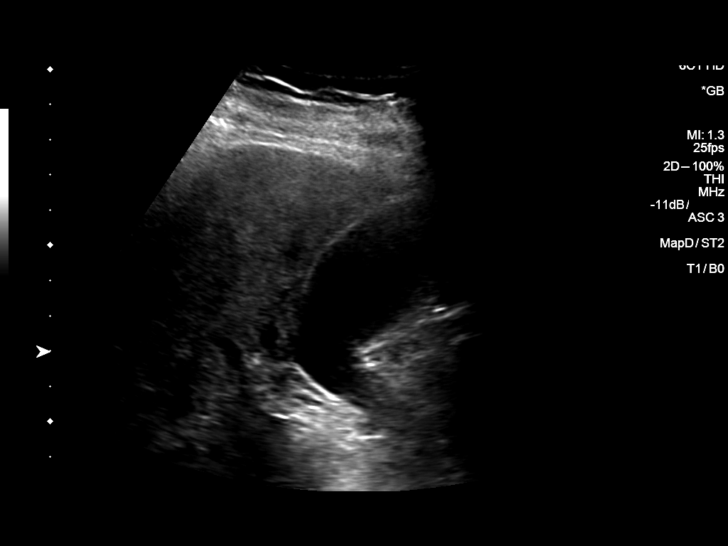
[im 7/73]
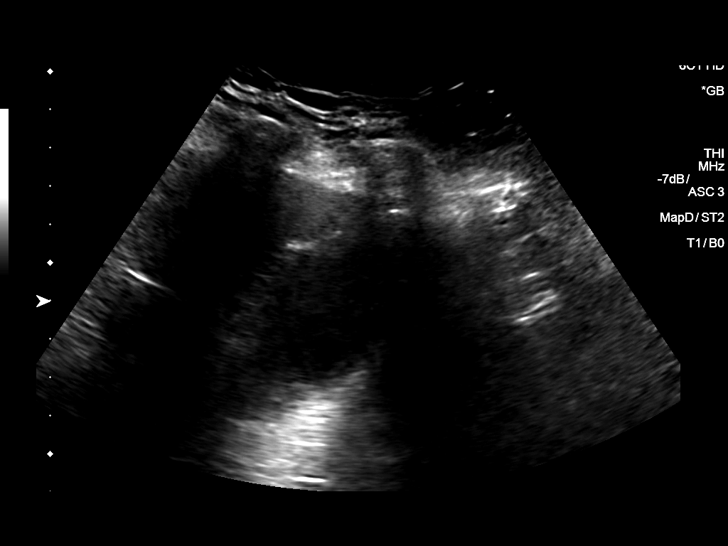
[im 13/73]
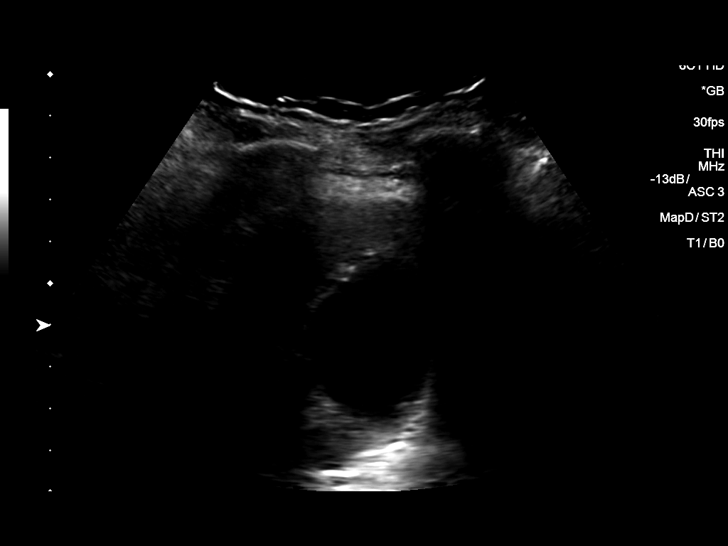
[im 19/73]
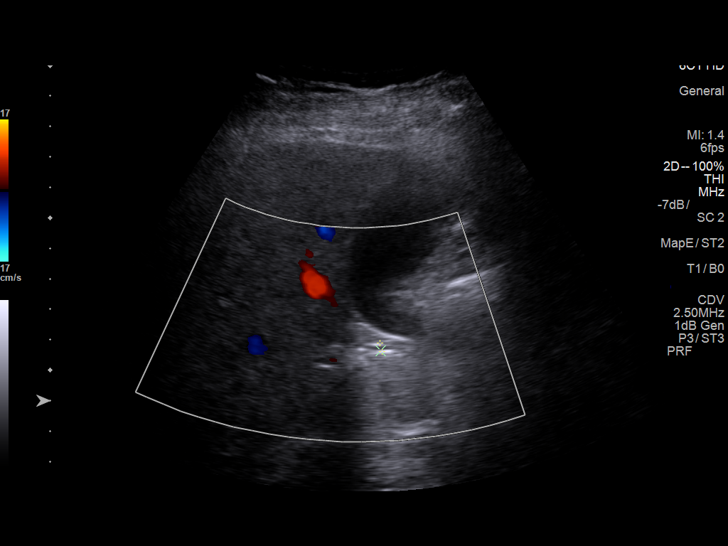
[im 25/73]
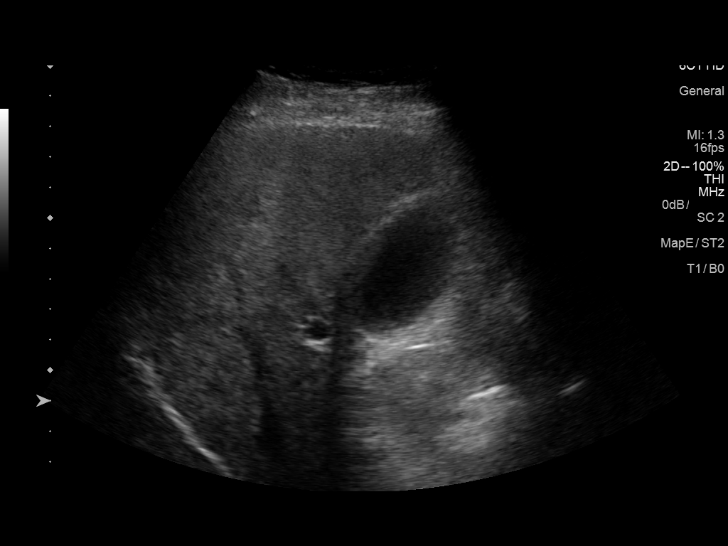
[im 28/73]
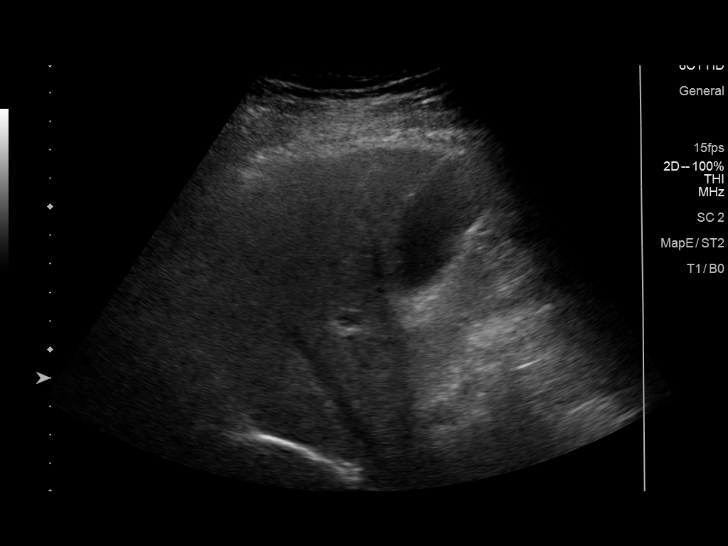
[im 34/73]
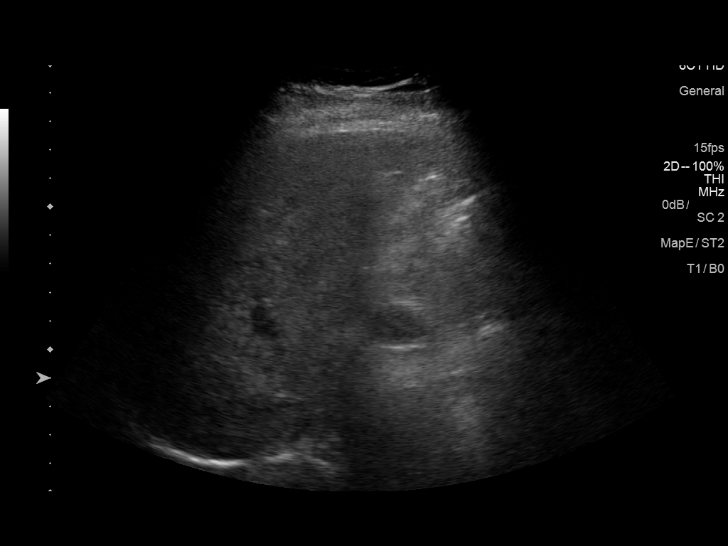
[im 40/73]
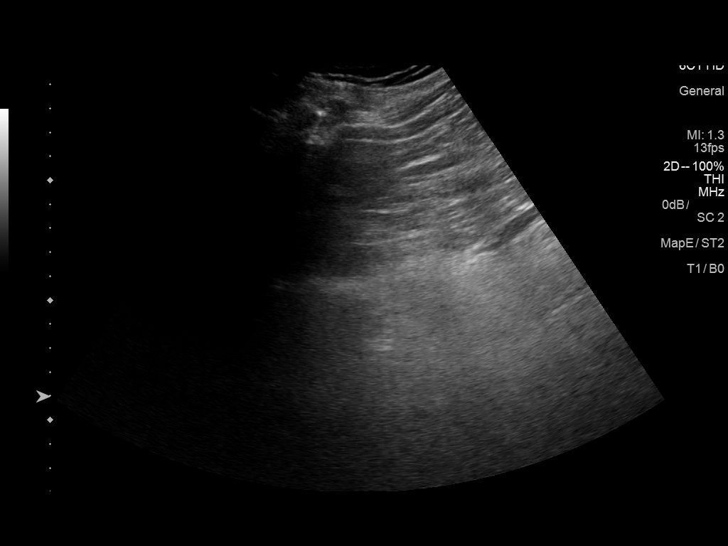
[im 46/73]
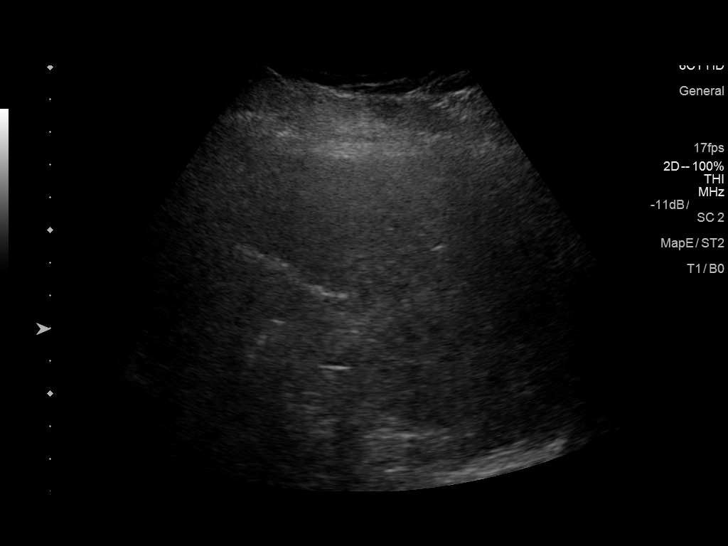
[im 49/73]
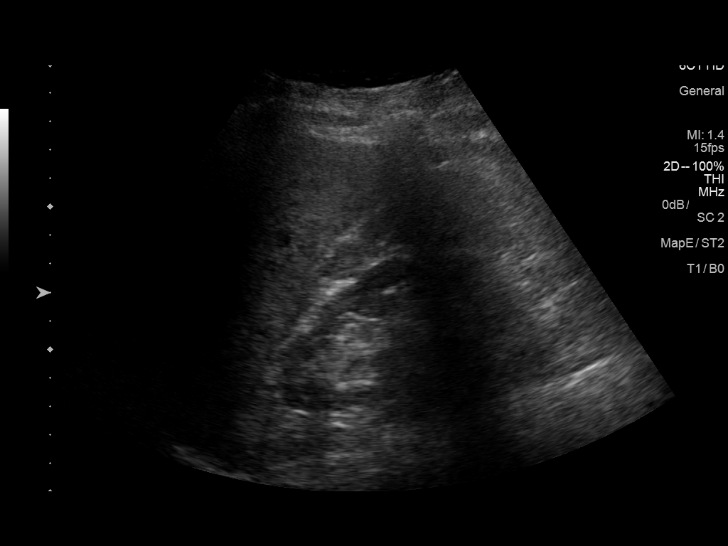
[im 55/73]
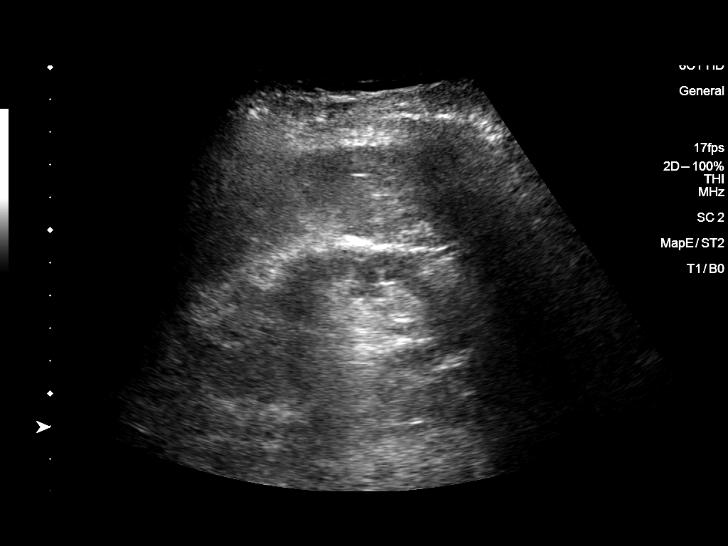
[im 61/73]
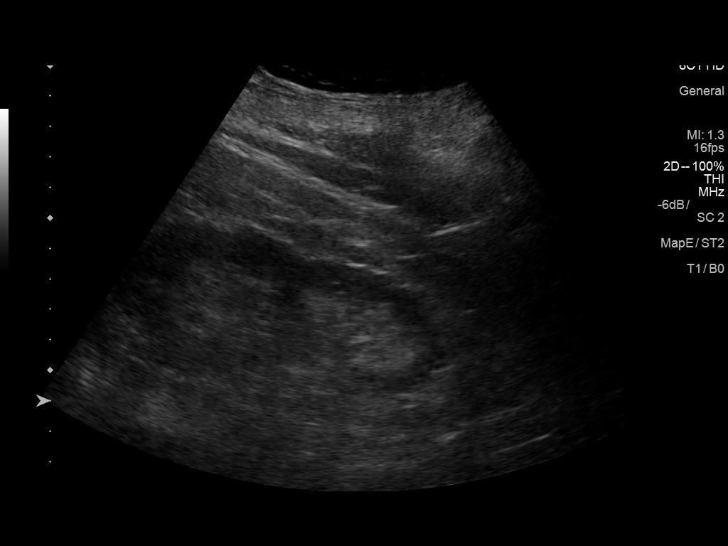
[im 67/73]
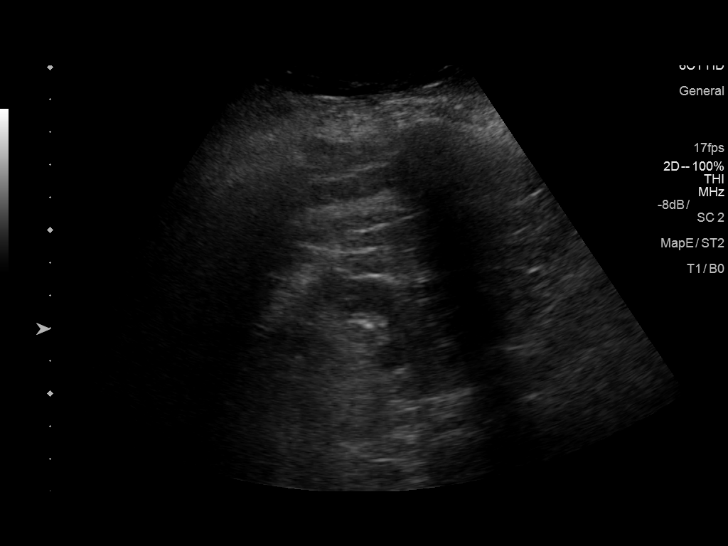
[im 73/73]
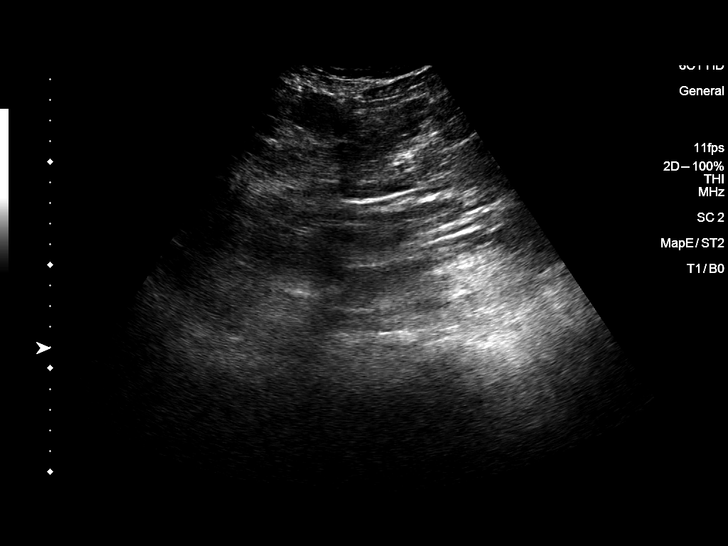

[14 of 25 positions shown; findings below may reference images not displayed]

FINDINGS: Gallbladder: No gallstones or wall thickening visualized. No
sonographic Murphy sign noted by sonographer.

Common bile duct: Diameter: 3 mm, normal.

Liver: No focal lesion identified. Heterogeneous, slightly increased
parenchymal echogenicity. Portal vein is patent on color Doppler
imaging with normal direction of blood flow towards the liver.

IVC: Obscured by bowel gas.

Pancreas: Obscured by bowel gas.

Spleen: Size and appearance within normal limits.

Right Kidney: Length: 10.0 cm. Echogenicity within normal limits. No
mass or hydronephrosis visualized.

Left Kidney: Length: 11.7 cm. Echogenicity within normal limits. No
mass or hydronephrosis visualized.

Abdominal aorta: Obscured by bowel gas.

Other findings: None.
IMPRESSION: 1. Known renal cysts are not well visualized by ultrasound.
2. Mildly heterogeneous liver may reflect underlying liver disease.

## 2020-03-29 ENCOUNTER — Telehealth: Payer: Self-pay | Admitting: Hematology

## 2020-03-29 NOTE — Telephone Encounter (Signed)
Rescheduled 11/24 appointment to 12/08 per provider pal, called and left a voicemail.

## 2020-04-05 ENCOUNTER — Ambulatory Visit: Payer: PPO | Admitting: Hematology

## 2020-04-05 ENCOUNTER — Other Ambulatory Visit: Payer: PPO

## 2020-04-19 ENCOUNTER — Inpatient Hospital Stay: Payer: PPO | Attending: Hematology

## 2020-04-19 ENCOUNTER — Inpatient Hospital Stay: Payer: PPO | Admitting: Hematology

## 2020-04-19 ENCOUNTER — Other Ambulatory Visit: Payer: Self-pay

## 2020-04-19 DIAGNOSIS — D649 Anemia, unspecified: Secondary | ICD-10-CM | POA: Diagnosis not present

## 2020-04-19 DIAGNOSIS — N189 Chronic kidney disease, unspecified: Secondary | ICD-10-CM | POA: Diagnosis not present

## 2020-04-19 LAB — CBC WITH DIFFERENTIAL/PLATELET
Abs Immature Granulocytes: 0.01 10*3/uL (ref 0.00–0.07)
Basophils Absolute: 0 10*3/uL (ref 0.0–0.1)
Basophils Relative: 1 %
Eosinophils Absolute: 0.4 10*3/uL (ref 0.0–0.5)
Eosinophils Relative: 5 %
HCT: 44 % (ref 39.0–52.0)
Hemoglobin: 14.7 g/dL (ref 13.0–17.0)
Immature Granulocytes: 0 %
Lymphocytes Relative: 19 %
Lymphs Abs: 1.4 10*3/uL (ref 0.7–4.0)
MCH: 32.1 pg (ref 26.0–34.0)
MCHC: 33.4 g/dL (ref 30.0–36.0)
MCV: 96.1 fL (ref 80.0–100.0)
Monocytes Absolute: 0.6 10*3/uL (ref 0.1–1.0)
Monocytes Relative: 8 %
Neutro Abs: 5 10*3/uL (ref 1.7–7.7)
Neutrophils Relative %: 67 %
Platelets: 157 10*3/uL (ref 150–400)
RBC: 4.58 MIL/uL (ref 4.22–5.81)
RDW: 11.9 % (ref 11.5–15.5)
WBC: 7.3 10*3/uL (ref 4.0–10.5)
nRBC: 0 % (ref 0.0–0.2)

## 2020-04-19 LAB — CMP (CANCER CENTER ONLY)
ALT: 31 U/L (ref 0–44)
AST: 23 U/L (ref 15–41)
Albumin: 4.1 g/dL (ref 3.5–5.0)
Alkaline Phosphatase: 72 U/L (ref 38–126)
Anion gap: 7 (ref 5–15)
BUN: 43 mg/dL — ABNORMAL HIGH (ref 8–23)
CO2: 29 mmol/L (ref 22–32)
Calcium: 10.4 mg/dL — ABNORMAL HIGH (ref 8.9–10.3)
Chloride: 104 mmol/L (ref 98–111)
Creatinine: 2.15 mg/dL — ABNORMAL HIGH (ref 0.61–1.24)
GFR, Estimated: 31 mL/min — ABNORMAL LOW (ref >60)
Glucose, Bld: 107 mg/dL — ABNORMAL HIGH (ref 70–99)
Potassium: 4.5 mmol/L (ref 3.5–5.1)
Sodium: 140 mmol/L (ref 135–145)
Total Bilirubin: 0.5 mg/dL (ref 0.3–1.2)
Total Protein: 7.3 g/dL (ref 6.5–8.1)

## 2020-04-19 LAB — IRON AND TIBC
Iron: 99 ug/dL (ref 42–163)
Saturation Ratios: 29 % (ref 20–55)
TIBC: 344 ug/dL (ref 202–409)
UIBC: 244 ug/dL (ref 117–376)

## 2020-04-19 LAB — FERRITIN: Ferritin: 228 ng/mL (ref 24–336)

## 2020-04-19 NOTE — Progress Notes (Signed)
HEMATOLOGY/ONCOLOGY CONSULTATION NOTE  Date of Service: 04/19/2020  Patient Care Team: Sueanne Margarita, DO as PCP - General (Internal Medicine)  CHIEF COMPLAINTS/PURPOSE OF CONSULTATION:  Mx of Hemochromatosis  HISTORY OF PRESENTING ILLNESS:   Andre Chen is a wonderful 79 y.o. male who has been referred to [place] by Dr Francesco Sor for evaluation and management of elevated ferritin/evaluation for hemochromatosis. Pt is accompanied today by Mrs. Apollo. The pt reports that he is doing well overall.   The pt currently follows with Dr. Sueanne Margarita for primary care but used to see Dr. Carlota Raspberry prior to his retirement. The pt reports that he has no family history of hemachromatosis and no history of heart disease or liver failure. Pt has not historically been a heavy drinker. It has been three years since he has had any alcohol and when he was drinking it was under one pack of beer per year. He has never been on iron supplements but has a significant history of cooking meals in cast-iron skillets. Pt eats steaks about once per week. Pt denies ever receiving a blood transfusion. Pt is unable to give blood, himself, due to a Hepatitis infection when he was in 3rd grade. Pt does not believe that he was ever diagnosed with hemochromatosis but notes that Dr. Carlota Raspberry discussed that at one point the pt may need therapeutic phlebotomies.   He was first told that he was having some liver dysfunction about 8 years ago. There has been some concern that the liver dysfunction was caused by Colchicine, which pt has been taking for many years. Pt believes that his gout triggers include ice cream, pecans, fish and shrimp. He has an upcoming appointment with Dr. Wilfrid Lund on 03/16 for further w/o of liver dysfunction.    Pt has dry, flaking skin that comes off in large patches. The patches occur most often on his head and around the nasolabial folds of his face. Pt uses OTC cream for his skin condition. He has never been  diagnosed with Psoriasis and has declined previous Dermatology referrals. Pt has had BCC and SCC removed by Dr. Carlota Raspberry.   Of note prior to the patient's visit today, pt has had [place] Abd ([id number]) completed on 12/29/2017 with results revealing "1. Known renal cysts are not well visualized by ultrasound. 2. Mildly heterogeneous liver may reflect underlying liver disease."   Most recent lab results (06/25/2019) of CMP is as follows: all values are WNL except for Glucose at 208, BUN at 34, Creatinine at 1.7, ALT at 94. 06/25/2019 Ferritin at 1017.7 06/25/2019 Iron and TIBC is as follows: Iron at 166, Iron Sat at 64, TIBC at 261, UIBC at 95.  On review of systems, pt denies abdominal pain, leg swelling, fatigue and any other symptoms.   On PMHx the pt reports Gout, HTN, BPH, BCC, SCC. On Social Hx the pt reports that he rarely drinks alcohol.  INTERVAL HISTORY:  Andre Chen is a wonderful 79 y.o. male who is here for evaluation and management of Iron overload/hemochromatosis. We are joined today by Mrs. Frei. The patient's last visit with [place] was on 10/05/2019. The pt reports that he is doing well overall.  The pt reports that he still has some dizziness when getting up in the morning. He has cut down on his red meat and wine intake. His blood glucose has been well-controlled lately. Pt notes that he has felt well in the interim.   Lab results today (04/19/20) of CBC w/diff  and CMP is as follows: all values are WNL except for Glucose at 107, BUN at 43, Creatinine at 2.15, Calcium at 10.4, GFR Est at 31. 04/19/2020 Ferritin at 228 04/19/2020 Iron Panel is as follows: Iron at 99, TIBC at 344, Sat Ratios at 29, UIBC at 244.  On review of systems, pt reports dizziness and denies leg swelling, abdominal pain and any other symptoms.   MEDICAL HISTORY:  Past Medical History:  Diagnosis Date  . Cataracts, bilateral   . Colon polyps   . Diabetes mellitus without complication ([place])   . Gout   .  Hyperlipemia   . Hypertension   . Obesity   Benign Prostatic Hyperplasia BCC  SCC   SURGICAL HISTORY: Past Surgical History:  Procedure Laterality Date  . APPENDECTOMY  1957  . CATARACT EXTRACTION W/ INTRAOCULAR LENS  IMPLANT, BILATERAL  1/15 and 2/15  . COLONOSCOPY  2004    SOCIAL HISTORY: Social History   Socioeconomic History  . Marital status: Married    Spouse name: Not on file  . Number of children: Not on file  . Years of education: Not on file  . Highest education level: Not on file  Occupational History  . Not on file  Tobacco Use  . Smoking status: Former Smoker    Quit date: 05/13/1972    Years since quitting: 47.9  . Smokeless tobacco: Former Systems developer    Quit date: 01/22/2000  Substance and Sexual Activity  . Alcohol use: No  . Drug use: No  . Sexual activity: Not on file  Other Topics Concern  . Not on file  Social History Narrative  . Not on file   Social Determinants of Health   Financial Resource Strain:   . Difficulty of Paying Living Expenses: Not on file  Food Insecurity:   . Worried About Charity fundraiser in the Last Year: Not on file  . Ran Out of Food in the Last Year: Not on file  Transportation Needs:   . Lack of Transportation (Medical): Not on file  . Lack of Transportation (Non-Medical): Not on file  Physical Activity:   . Days of Exercise per Week: Not on file  . Minutes of Exercise per Session: Not on file  Stress:   . Feeling of Stress : Not on file  Social Connections:   . Frequency of Communication with Friends and Family: Not on file  . Frequency of Social Gatherings with Friends and Family: Not on file  . Attends Religious Services: Not on file  . Active Member of Clubs or Organizations: Not on file  . Attends Archivist Meetings: Not on file  . Marital Status: Not on file  Intimate Partner Violence:   . Fear of Current or Ex-Partner: Not on file  . Emotionally Abused: Not on file  . Physically Abused: Not on  file  . Sexually Abused: Not on file    FAMILY HISTORY: Family History  Problem Relation Age of Onset  . Colon cancer Neg Hx   . Esophageal cancer Neg Hx   . Rectal cancer Neg Hx   . Stomach cancer Neg Hx     ALLERGIES:  has No Known Allergies.  MEDICATIONS:  Current Outpatient Medications  Medication Sig Dispense Refill  . Alcohol Swabs ([facility] PREP) 70 % PADS Apply 1 each topically 3 (three) times daily.    Marland Kitchen atenolol-chlorthalidone (TENORETIC) 100-25 MG per tablet Take 1 tablet by mouth daily.    . colchicine 0.6  MG tablet Take 0.6 mg by mouth every other day.     . doxazosin (CARDURA) 4 MG tablet Take 4 mg by mouth daily.    Marland Kitchen etodolac (LODINE) 400 MG tablet Take 400 mg by mouth as needed.     . Febuxostat (ULORIC) 80 MG TABS Take 40 mg by mouth daily.     Marland Kitchen losartan (COZAAR) 100 MG tablet Take 50 mg by mouth daily.    Glory Rosebush Delica Lancets 38L MISC Apply 1 each topically 3 (three) times daily.    Glory Rosebush VERIO test strip 1 each daily.    Marland Kitchen OZEMPIC, 0.25 OR 0.5 MG/DOSE, 2 MG/1.5ML SOPN     . polyethylene glycol (MIRALAX / GLYCOLAX) 17 g packet Take 17 g by mouth daily.     No current facility-administered medications for this visit.    REVIEW OF SYSTEMS:   A 10+ POINT REVIEW OF SYSTEMS WAS OBTAINED including neurology, dermatology, psychiatry, cardiac, respiratory, lymph, extremities, GI, GU, Musculoskeletal, constitutional, breasts, reproductive, HEENT.  All pertinent positives are noted in the HPI.  All others are negative.   PHYSICAL EXAMINATION: ECOG PERFORMANCE STATUS: 0 - Asymptomatic  . Vitals:   04/19/20 1007  BP: (!) 147/81  Pulse: 68  Resp: 14  Temp: (!) 97.2 F (36.2 C)  SpO2: 100%   Filed Weights   04/19/20 1006 04/19/20 1007  Weight: 204 lb 6.4 oz (92.7 kg) 204 lb 6.4 oz (92.7 kg)   .Body mass index is 29.75 kg/m.   GENERAL:alert, in no acute distress and comfortable SKIN: no acute rashes, no significant lesions EYES: conjunctiva are pink  and non-injected, sclera anicteric OROPHARYNX: MMM, no exudates, no oropharyngeal erythema or ulceration NECK: supple, no JVD LYMPH:  no palpable lymphadenopathy in the cervical, axillary or inguinal regions LUNGS: clear to auscultation b/l with normal respiratory effort HEART: regular rate & rhythm ABDOMEN:  normoactive bowel sounds , non tender, not distended. No palpable hepatosplenomegaly.  Extremity: no pedal edema PSYCH: alert & oriented x 3 with fluent speech NEURO: no focal motor/sensory deficits  LABORATORY DATA:  I have reviewed the data as listed  . CBC Latest Ref Rng & Units 04/19/2020 10/05/2019 09/21/2019  WBC 4.0 - 10.5 K/uL 7.3 5.1 5.4  Hemoglobin 13.0 - 17.0 g/dL 14.7 10.5(L) 10.3(L)  Hematocrit 39 - 52 % 44.0 32.8(L) 32.2(L)  Platelets 150 - 400 K/uL 157 161 168    . CMP Latest Ref Rng & Units 04/19/2020 10/05/2019 09/21/2019  Glucose 70 - 99 mg/dL 107(H) 124(H) 122(H)  BUN 8 - 23 mg/dL 43(H) 40(H) 24(H)  Creatinine 0.61 - 1.24 mg/dL 2.15(H) 1.87(H) 1.75(H)  Sodium 135 - 145 mmol/L 140 141 141  Potassium 3.5 - 5.1 mmol/L 4.5 4.9 4.5  Chloride 98 - 111 mmol/L 104 109 107  CO2 22 - 32 mmol/L 29 23 25   Calcium 8.9 - 10.3 mg/dL 10.4(H) 9.6 9.4  Total Protein 6.5 - 8.1 g/dL 7.3 6.5 6.3(L)  Total Bilirubin 0.3 - 1.2 mg/dL 0.5 0.3 0.4  Alkaline Phos 38 - 126 U/L 72 64 65  AST 15 - 41 U/L 23 26 25   ALT 0 - 44 U/L 31 33 34   . Lab Results  Component Value Date   IRON 99 04/19/2020   TIBC 344 04/19/2020   IRONPCTSAT 29 04/19/2020   (Iron and TIBC)  Lab Results  Component Value Date   FERRITIN 228 04/19/2020     07/21/2019:   RADIOGRAPHIC STUDIES: I have personally reviewed the  radiological images as listed and agreed with the findings in the report. No results found.  ASSESSMENT & PLAN:   89 y/omale with   1) Elevated ferritin levels concerning for hereditary hemochromatosis vs secondary hemosiderosis from chronic liver disease.  12/29/2017 [place] Abd  ([id number]) revealed "1. Known renal cysts are not well visualized by ultrasound. 2. Mildly heterogeneous liver may reflect underlying liver disease."  07/21/2019 Hemochromatosis shows "Single Mutation Identified (H63D)" 07/28/2019 [place] Abd ([id number]) revealed "No evidence of cholelithiasis or biliary ductal dilatation. Diffuse hepatocellular disease, similar in appearance to prior study. No hepatic mass visualized."  PLAN: -Discussed pt labwork today, 04/19/20; anemia is resolved, blood counts are nml, kidney numbers are up slightly, Ferritin is okay.  -No indication for a therapeutic phlebotomy today as Ferritin <300. No risk of organ injury with current Ferritin levels.  -Advised pt that Doxazosin can cause orthostatic hypotension. Recommend pt move his legs about before standing in the morning.  -Advised pt that he can donate blood, but some blood banks don't accept donations from pt's with Hemachromatosis.  -Advised pt that CKD can contribute to anemia. Would keep Ferritin slightly higher due to this.  -Will balance hemachromatosis and CKD to prevent significant anemia or organ injury.  -Will see back in 6 months with labs and a therapeutic phlebotomy.    FOLLOW UP: RTC with Dr Irene Limbo with labs and therapeutic phlebotomy in 6 months   The total time spent in the appt was 20 minutes and more than 50% was on counseling and direct patient cares.  All of the patient's questions were answered with apparent satisfaction. The patient knows to call the clinic with any problems, questions or concerns.    Sullivan Lone MD [place] AAHIVMS [facility] Hematology/Oncology Physician [facility]  (Office):       [phone] (Work cell):  [phone] (Fax):           [phone]  04/19/2020 11:28 AM  I, Yevette Edwards, am acting as a scribe for Dr. Sullivan Lone.   .I have reviewed the above documentation for accuracy and completeness, and I agree with the above. Brunetta Genera MD

## 2020-04-24 ENCOUNTER — Telehealth: Payer: Self-pay | Admitting: *Deleted

## 2020-04-24 NOTE — Telephone Encounter (Signed)
Patient spouse called - would like results of recent lab work related to iron overload. Dr. Irene Limbo informed.  Per Dr. Irene Limbo - contacted patient/spouse with following information, LVM as requested on [phone]: Ferritin 228. Continues to improve. Given CKD will recommend therpaeutic phlebotomy if ferritin >300. So no therapeutic phlebotomy currently. RTC with Dr Irene Limbo with labs and therapeutic phlebotomy in 6 months. Advised patient to contact office for any questions or concerns prior to June 2022 appts

## 2020-04-28 DIAGNOSIS — M109 Gout, unspecified: Secondary | ICD-10-CM | POA: Diagnosis not present

## 2020-04-28 DIAGNOSIS — I129 Hypertensive chronic kidney disease with stage 1 through stage 4 chronic kidney disease, or unspecified chronic kidney disease: Secondary | ICD-10-CM | POA: Diagnosis not present

## 2020-04-28 DIAGNOSIS — N179 Acute kidney failure, unspecified: Secondary | ICD-10-CM | POA: Diagnosis not present

## 2020-04-28 DIAGNOSIS — E1169 Type 2 diabetes mellitus with other specified complication: Secondary | ICD-10-CM | POA: Diagnosis not present

## 2020-04-28 DIAGNOSIS — G2581 Restless legs syndrome: Secondary | ICD-10-CM | POA: Diagnosis not present

## 2020-04-28 DIAGNOSIS — N1831 Chronic kidney disease, stage 3a: Secondary | ICD-10-CM | POA: Diagnosis not present

## 2020-04-28 DIAGNOSIS — K5900 Constipation, unspecified: Secondary | ICD-10-CM | POA: Diagnosis not present

## 2020-07-26 DIAGNOSIS — I10 Essential (primary) hypertension: Secondary | ICD-10-CM | POA: Diagnosis not present

## 2020-07-26 DIAGNOSIS — Z125 Encounter for screening for malignant neoplasm of prostate: Secondary | ICD-10-CM | POA: Diagnosis not present

## 2020-07-26 DIAGNOSIS — M109 Gout, unspecified: Secondary | ICD-10-CM | POA: Diagnosis not present

## 2020-07-26 DIAGNOSIS — E1169 Type 2 diabetes mellitus with other specified complication: Secondary | ICD-10-CM | POA: Diagnosis not present

## 2020-08-02 DIAGNOSIS — E1169 Type 2 diabetes mellitus with other specified complication: Secondary | ICD-10-CM | POA: Diagnosis not present

## 2020-08-02 DIAGNOSIS — Z0000 Encounter for general adult medical examination without abnormal findings: Secondary | ICD-10-CM | POA: Diagnosis not present

## 2020-08-02 DIAGNOSIS — N1831 Chronic kidney disease, stage 3a: Secondary | ICD-10-CM | POA: Diagnosis not present

## 2020-08-02 DIAGNOSIS — Z1389 Encounter for screening for other disorder: Secondary | ICD-10-CM | POA: Diagnosis not present

## 2020-08-02 DIAGNOSIS — D692 Other nonthrombocytopenic purpura: Secondary | ICD-10-CM | POA: Diagnosis not present

## 2020-08-02 DIAGNOSIS — I10 Essential (primary) hypertension: Secondary | ICD-10-CM | POA: Diagnosis not present

## 2020-08-02 DIAGNOSIS — Z1212 Encounter for screening for malignant neoplasm of rectum: Secondary | ICD-10-CM | POA: Diagnosis not present

## 2020-08-02 DIAGNOSIS — R82998 Other abnormal findings in urine: Secondary | ICD-10-CM | POA: Diagnosis not present

## 2020-08-02 DIAGNOSIS — Z1331 Encounter for screening for depression: Secondary | ICD-10-CM | POA: Diagnosis not present

## 2020-08-02 DIAGNOSIS — M109 Gout, unspecified: Secondary | ICD-10-CM | POA: Diagnosis not present

## 2020-08-02 DIAGNOSIS — D696 Thrombocytopenia, unspecified: Secondary | ICD-10-CM | POA: Diagnosis not present

## 2020-08-03 ENCOUNTER — Telehealth: Payer: Self-pay | Admitting: Hematology

## 2020-08-03 NOTE — Telephone Encounter (Signed)
Changed provider appointment due to provider's PAL. Patient's wife is aware of changes.

## 2020-10-11 ENCOUNTER — Encounter: Payer: Self-pay | Admitting: Hematology

## 2020-10-17 ENCOUNTER — Other Ambulatory Visit: Payer: Self-pay

## 2020-10-18 ENCOUNTER — Ambulatory Visit: Payer: PPO | Admitting: Physician Assistant

## 2020-10-18 ENCOUNTER — Inpatient Hospital Stay: Payer: HMO

## 2020-10-18 ENCOUNTER — Other Ambulatory Visit: Payer: Self-pay

## 2020-10-18 ENCOUNTER — Ambulatory Visit: Payer: PPO | Admitting: Hematology

## 2020-10-18 ENCOUNTER — Inpatient Hospital Stay: Payer: HMO | Admitting: Hematology and Oncology

## 2020-10-18 ENCOUNTER — Encounter: Payer: Self-pay | Admitting: Hematology and Oncology

## 2020-10-18 ENCOUNTER — Inpatient Hospital Stay: Payer: HMO | Attending: Hematology and Oncology

## 2020-10-18 DIAGNOSIS — E1122 Type 2 diabetes mellitus with diabetic chronic kidney disease: Secondary | ICD-10-CM | POA: Diagnosis not present

## 2020-10-18 DIAGNOSIS — N189 Chronic kidney disease, unspecified: Secondary | ICD-10-CM | POA: Diagnosis not present

## 2020-10-18 DIAGNOSIS — D649 Anemia, unspecified: Secondary | ICD-10-CM | POA: Diagnosis not present

## 2020-10-18 DIAGNOSIS — I129 Hypertensive chronic kidney disease with stage 1 through stage 4 chronic kidney disease, or unspecified chronic kidney disease: Secondary | ICD-10-CM | POA: Diagnosis not present

## 2020-10-18 LAB — CMP (CANCER CENTER ONLY)
ALT: 25 U/L (ref 0–44)
AST: 17 U/L (ref 15–41)
Albumin: 3.9 g/dL (ref 3.5–5.0)
Alkaline Phosphatase: 81 U/L (ref 38–126)
Anion gap: 10 (ref 5–15)
BUN: 43 mg/dL — ABNORMAL HIGH (ref 8–23)
CO2: 24 mmol/L (ref 22–32)
Calcium: 9.8 mg/dL (ref 8.9–10.3)
Chloride: 109 mmol/L (ref 98–111)
Creatinine: 2.07 mg/dL — ABNORMAL HIGH (ref 0.61–1.24)
GFR, Estimated: 32 mL/min — ABNORMAL LOW (ref >60)
Glucose, Bld: 102 mg/dL — ABNORMAL HIGH (ref 70–99)
Potassium: 4.3 mmol/L (ref 3.5–5.1)
Sodium: 143 mmol/L (ref 135–145)
Total Bilirubin: 0.3 mg/dL (ref 0.3–1.2)
Total Protein: 6.9 g/dL (ref 6.5–8.1)

## 2020-10-18 LAB — CBC WITH DIFFERENTIAL (CANCER CENTER ONLY)
Abs Immature Granulocytes: 0.03 10*3/uL (ref 0.00–0.07)
Basophils Absolute: 0 10*3/uL (ref 0.0–0.1)
Basophils Relative: 0 %
Eosinophils Absolute: 0.4 10*3/uL (ref 0.0–0.5)
Eosinophils Relative: 5 %
HCT: 40.6 % (ref 39.0–52.0)
Hemoglobin: 13.8 g/dL (ref 13.0–17.0)
Immature Granulocytes: 0 %
Lymphocytes Relative: 20 %
Lymphs Abs: 1.4 10*3/uL (ref 0.7–4.0)
MCH: 32.5 pg (ref 26.0–34.0)
MCHC: 34 g/dL (ref 30.0–36.0)
MCV: 95.8 fL (ref 80.0–100.0)
Monocytes Absolute: 0.6 10*3/uL (ref 0.1–1.0)
Monocytes Relative: 8 %
Neutro Abs: 4.8 10*3/uL (ref 1.7–7.7)
Neutrophils Relative %: 67 %
Platelet Count: 143 10*3/uL — ABNORMAL LOW (ref 150–400)
RBC: 4.24 MIL/uL (ref 4.22–5.81)
RDW: 12.3 % (ref 11.5–15.5)
WBC Count: 7.2 10*3/uL (ref 4.0–10.5)
nRBC: 0 % (ref 0.0–0.2)

## 2020-10-18 LAB — IRON AND TIBC
Iron: 86 ug/dL (ref 42–163)
Saturation Ratios: 28 % (ref 20–55)
TIBC: 304 ug/dL (ref 202–409)
UIBC: 218 ug/dL (ref 117–376)

## 2020-10-18 LAB — FERRITIN: Ferritin: 276 ng/mL (ref 24–336)

## 2020-10-18 NOTE — Progress Notes (Signed)
HEMATOLOGY/ONCOLOGY PROGRESS NOTE  Date of Service: 10/18/2020  Patient Care Team: Sueanne Margarita, DO as PCP - General (Internal Medicine)  CHIEF COMPLAINTS/PURPOSE OF CONSULTATION:  Mx of Hemochromatosis  HISTORY OF PRESENTING ILLNESS:   Andre Chen is a wonderful 80 y.o. male who has been referred to [place] by Dr Francesco Sor for evaluation and management of elevated ferritin/evaluation for hemochromatosis. Pt is accompanied today by Mrs. Ebersole. The pt reports that he is doing well overall.   The pt currently follows with Dr. Sueanne Margarita for primary care but used to see Dr. Carlota Raspberry prior to his retirement. The pt reports that he has no family history of hemachromatosis and no history of heart disease or liver failure. Pt has not historically been a heavy drinker. It has been three years since he has had any alcohol and when he was drinking it was under one pack of beer per year. He has never been on iron supplements but has a significant history of cooking meals in cast-iron skillets. Pt eats steaks about once per week. Pt denies ever receiving a blood transfusion. Pt is unable to give blood, himself, due to a Hepatitis infection when he was in 3rd grade. Pt does not believe that he was ever diagnosed with hemochromatosis but notes that Dr. Carlota Raspberry discussed that at one point the pt may need therapeutic phlebotomies.   He was first told that he was having some liver dysfunction about 8 years ago. There has been some concern that the liver dysfunction was caused by Colchicine, which pt has been taking for many years. Pt believes that his gout triggers include ice cream, pecans, fish and shrimp. He has an upcoming appointment with Dr. Wilfrid Lund on 03/16 for further w/o of liver dysfunction.    Pt has dry, flaking skin that comes off in large patches. The patches occur most often on his head and around the nasolabial folds of his face. Pt uses OTC cream for his skin condition. He has never been  diagnosed with Psoriasis and has declined previous Dermatology referrals. Pt has had BCC and SCC removed by Dr. Carlota Raspberry.   Of note prior to the patient's visit today, pt has had [place] Abd ([id number]) completed on 12/29/2017 with results revealing "1. Known renal cysts are not well visualized by ultrasound. 2. Mildly heterogeneous liver may reflect underlying liver disease."   Most recent lab results (06/25/2019) of CMP is as follows: all values are WNL except for Glucose at 208, BUN at 34, Creatinine at 1.7, ALT at 94. 06/25/2019 Ferritin at 1017.7 06/25/2019 Iron and TIBC is as follows: Iron at 166, Iron Sat at 64, TIBC at 261, UIBC at 95.  On review of systems, pt denies abdominal pain, leg swelling, fatigue and any other symptoms.   On PMHx the pt reports Gout, HTN, BPH, BCC, SCC. On Social Hx the pt reports that he rarely drinks alcohol.  INTERVAL HISTORY:  Andre Chen is a wonderful 80 y.o. male who is here for evaluation and management of Iron overload/hemochromatosis. We are joined today by Mrs. Crosson. The patient's last visit with [place] was on 06/10/2019. The pt reports that he is doing well overall.  On exam today Mr. Decock notes he has been well in the interim since his last visit.  He reports that it has been quite a long time since he last had a phlebotomy.  He notes he has not had any hospitalizations or emergency room visits in the interim since her  last visit.  He has started Trulicity for diabetes but otherwise no changes in his medications.  He denies any lower extremity swelling, though he does report that he "bruises on occasion".  His energy is good and his appetite has been fine.  He otherwise denies any fevers, chills, sweats, nausea, vomiting or diarrhea.  A full 10 point ROS is listed below.  Lab results today (10/18/20) of CBC w/diff and CMP is as follows: WBC 7.2, Hgb 13.8, MCV 95.8, Plt 143. Ferritin 276.  On review of systems, pt reports dizziness and denies leg swelling,  abdominal pain and any other symptoms.   MEDICAL HISTORY:  Past Medical History:  Diagnosis Date  . Cataracts, bilateral   . Colon polyps   . Diabetes mellitus without complication ([place])   . Gout   . Hyperlipemia   . Hypertension   . Obesity   Benign Prostatic Hyperplasia BCC  SCC   SURGICAL HISTORY: Past Surgical History:  Procedure Laterality Date  . APPENDECTOMY  1957  . CATARACT EXTRACTION W/ INTRAOCULAR LENS  IMPLANT, BILATERAL  1/15 and 2/15  . COLONOSCOPY  2004    SOCIAL HISTORY: Social History   Socioeconomic History  . Marital status: Married    Spouse name: Not on file  . Number of children: Not on file  . Years of education: Not on file  . Highest education level: Not on file  Occupational History  . Not on file  Tobacco Use  . Smoking status: Former Smoker    Quit date: 05/13/1972    Years since quitting: 48.4  . Smokeless tobacco: Former Systems developer    Quit date: 01/22/2000  Substance and Sexual Activity  . Alcohol use: No  . Drug use: No  . Sexual activity: Not on file  Other Topics Concern  . Not on file  Social History Narrative  . Not on file   Social Determinants of Health   Financial Resource Strain: Not on file  Food Insecurity: Not on file  Transportation Needs: Not on file  Physical Activity: Not on file  Stress: Not on file  Social Connections: Not on file  Intimate Partner Violence: Not on file    FAMILY HISTORY: Family History  Problem Relation Age of Onset  . Colon cancer Neg Hx   . Esophageal cancer Neg Hx   . Rectal cancer Neg Hx   . Stomach cancer Neg Hx     ALLERGIES:  has No Known Allergies.  MEDICATIONS:  Current Outpatient Medications  Medication Sig Dispense Refill  . Alcohol Swabs ([facility] PREP) 70 % PADS Apply 1 each topically 3 (three) times daily.    Marland Kitchen atenolol-chlorthalidone (TENORETIC) 100-25 MG per tablet Take 1 tablet by mouth daily.    . colchicine 0.6 MG tablet Take 0.6 mg by mouth every other day.     .  doxazosin (CARDURA) 4 MG tablet Take 4 mg by mouth daily.    Marland Kitchen etodolac (LODINE) 400 MG tablet Take 400 mg by mouth as needed.     . Febuxostat (ULORIC) 80 MG TABS Take 40 mg by mouth daily.     Marland Kitchen losartan (COZAAR) 100 MG tablet Take 50 mg by mouth daily.    . magnesium hydroxide (MILK OF MAGNESIA) 400 MG/5ML suspension Take 30 mLs by mouth daily as needed for mild constipation.    Glory Rosebush Delica Lancets 62E MISC Apply 1 each topically 3 (three) times daily.    Glory Rosebush VERIO test strip 1 each daily.    Marland Kitchen  OZEMPIC, 0.25 OR 0.5 MG/DOSE, 2 MG/1.5ML SOPN  (Patient not taking: Reported on 04/19/2020)    . polyethylene glycol (MIRALAX / GLYCOLAX) 17 g packet Take 17 g by mouth daily. (Patient not taking: Reported on 04/19/2020)    . TRULICITY 1.5 FU/9.3AT SOPN Inject into the skin.     No current facility-administered medications for this visit.    REVIEW OF SYSTEMS:   A 10+ POINT REVIEW OF SYSTEMS WAS OBTAINED including neurology, dermatology, psychiatry, cardiac, respiratory, lymph, extremities, GI, GU, Musculoskeletal, constitutional, breasts, reproductive, HEENT.  All pertinent positives are noted in the HPI.  All others are negative.   PHYSICAL EXAMINATION: ECOG PERFORMANCE STATUS: 0 - Asymptomatic  . Vitals:   10/18/20 1301  BP: (!) 156/56  Pulse: 70  Resp: 18  Temp: 98 F (36.7 C)  SpO2: 99%   Filed Weights   10/18/20 1301  Weight: 203 lb 14.4 oz (92.5 kg)   .Body mass index is 29.68 kg/m.   GENERAL: well appearing elderly [place] male. alert, in no acute distress and comfortable SKIN: no acute rashes, no significant lesions EYES: conjunctiva are pink and non-injected, sclera anicteric LUNGS: clear to auscultation b/l with normal respiratory effort HEART: regular rate & rhythm distended.   Extremity: no pedal edema PSYCH: alert & oriented x 3 with fluent speech NEURO: no focal motor/sensory deficits  LABORATORY DATA:  I have reviewed the data as listed  . CBC  Latest Ref Rng & Units 10/18/2020 04/19/2020 10/05/2019  WBC 4.0 - 10.5 K/uL 7.2 7.3 5.1  Hemoglobin 13.0 - 17.0 g/dL 13.8 14.7 10.5(L)  Hematocrit 39.0 - 52.0 % 40.6 44.0 32.8(L)  Platelets 150 - 400 K/uL 143(L) 157 161    . CMP Latest Ref Rng & Units 10/18/2020 04/19/2020 10/05/2019  Glucose 70 - 99 mg/dL 102(H) 107(H) 124(H)  BUN 8 - 23 mg/dL 43(H) 43(H) 40(H)  Creatinine 0.61 - 1.24 mg/dL 2.07(H) 2.15(H) 1.87(H)  Sodium 135 - 145 mmol/L 143 140 141  Potassium 3.5 - 5.1 mmol/L 4.3 4.5 4.9  Chloride 98 - 111 mmol/L 109 104 109  CO2 22 - 32 mmol/L 24 29 23   Calcium 8.9 - 10.3 mg/dL 9.8 10.4(H) 9.6  Total Protein 6.5 - 8.1 g/dL 6.9 7.3 6.5  Total Bilirubin 0.3 - 1.2 mg/dL 0.3 0.5 0.3  Alkaline Phos 38 - 126 U/L 81 72 64  AST 15 - 41 U/L 17 23 26   ALT 0 - 44 U/L 25 31 33   . Lab Results  Component Value Date   IRON 86 10/18/2020   TIBC 304 10/18/2020   IRONPCTSAT 28 10/18/2020   (Iron and TIBC)  Lab Results  Component Value Date   FERRITIN 276 10/18/2020     07/21/2019:   RADIOGRAPHIC STUDIES: I have personally reviewed the radiological images as listed and agreed with the findings in the report. No results found.  ASSESSMENT & PLAN:   31 y/omale with   1) Elevated ferritin levels concerning for hereditary hemochromatosis vs secondary hemosiderosis from chronic liver disease. Patient found to be heterozygous for H63D mutation.   12/29/2017 [place] Abd ([id number]) revealed "1. Known renal cysts are not well visualized by ultrasound. 2. Mildly heterogeneous liver may reflect underlying liver disease."  07/21/2019 Hemochromatosis shows "Single Mutation Identified (H63D)" 07/28/2019 [place] Abd ([id number]) revealed "No evidence of cholelithiasis or biliary ductal dilatation. Diffuse hepatocellular disease, similar in appearance to prior study. No hepatic mass visualized."  PLAN: -Discussed pt labwork today, 10/18/20; Hgb is stable, blood counts  are nml -Awaiting results of ferritin  to determine if there is an indication for a therapeutic phlebotomy -Advised pt that he can donate blood, but some blood banks don't accept donations from pt's with Hemachromatosis.  -Advised pt that CKD can contribute to anemia. Would keep Ferritin slightly higher due to this.  -Will balance hemachromatosis and CKD to prevent significant anemia or organ injury.  -Will see back in 6 months with labs and a therapeutic phlebotomy.    FOLLOW UP: RTC with Dr Irene Limbo with labs and therapeutic phlebotomy in 6 months  The total time spent in the appt was 30 minutes and more than 50% was on counseling and direct patient cares.  All of the patient's questions were answered with apparent satisfaction. The patient knows to call the clinic with any problems, questions or concerns.    Ledell Peoples, MD Department of Hematology/Oncology [place] [facility] Phone: [phone] Pager: [phone] Email: [email or web address]

## 2020-10-20 ENCOUNTER — Telehealth: Payer: Self-pay | Admitting: Hematology and Oncology

## 2020-10-20 NOTE — Telephone Encounter (Signed)
Scheduled per los. Called and left msg. Mailed printout  °

## 2020-10-25 ENCOUNTER — Telehealth: Payer: Self-pay

## 2020-10-25 NOTE — Telephone Encounter (Signed)
Returned call to pt per request to give pt most recent iron level. Pt acknowledged.

## 2020-12-10 DIAGNOSIS — E1122 Type 2 diabetes mellitus with diabetic chronic kidney disease: Secondary | ICD-10-CM | POA: Diagnosis not present

## 2020-12-10 DIAGNOSIS — I129 Hypertensive chronic kidney disease with stage 1 through stage 4 chronic kidney disease, or unspecified chronic kidney disease: Secondary | ICD-10-CM | POA: Diagnosis not present

## 2020-12-10 DIAGNOSIS — G2581 Restless legs syndrome: Secondary | ICD-10-CM | POA: Diagnosis not present

## 2020-12-10 DIAGNOSIS — N1831 Chronic kidney disease, stage 3a: Secondary | ICD-10-CM | POA: Diagnosis not present

## 2021-01-10 DIAGNOSIS — E1122 Type 2 diabetes mellitus with diabetic chronic kidney disease: Secondary | ICD-10-CM | POA: Diagnosis not present

## 2021-01-10 DIAGNOSIS — I129 Hypertensive chronic kidney disease with stage 1 through stage 4 chronic kidney disease, or unspecified chronic kidney disease: Secondary | ICD-10-CM | POA: Diagnosis not present

## 2021-01-10 DIAGNOSIS — G2581 Restless legs syndrome: Secondary | ICD-10-CM | POA: Diagnosis not present

## 2021-01-10 DIAGNOSIS — N1831 Chronic kidney disease, stage 3a: Secondary | ICD-10-CM | POA: Diagnosis not present

## 2021-02-02 DIAGNOSIS — E1169 Type 2 diabetes mellitus with other specified complication: Secondary | ICD-10-CM | POA: Diagnosis not present

## 2021-02-02 DIAGNOSIS — N1831 Chronic kidney disease, stage 3a: Secondary | ICD-10-CM | POA: Diagnosis not present

## 2021-02-02 DIAGNOSIS — M1990 Unspecified osteoarthritis, unspecified site: Secondary | ICD-10-CM | POA: Diagnosis not present

## 2021-02-02 DIAGNOSIS — D692 Other nonthrombocytopenic purpura: Secondary | ICD-10-CM | POA: Diagnosis not present

## 2021-02-02 DIAGNOSIS — Z7189 Other specified counseling: Secondary | ICD-10-CM | POA: Diagnosis not present

## 2021-02-02 DIAGNOSIS — I129 Hypertensive chronic kidney disease with stage 1 through stage 4 chronic kidney disease, or unspecified chronic kidney disease: Secondary | ICD-10-CM | POA: Diagnosis not present

## 2021-02-02 DIAGNOSIS — M109 Gout, unspecified: Secondary | ICD-10-CM | POA: Diagnosis not present

## 2021-02-02 DIAGNOSIS — Z23 Encounter for immunization: Secondary | ICD-10-CM | POA: Diagnosis not present

## 2021-02-02 DIAGNOSIS — N179 Acute kidney failure, unspecified: Secondary | ICD-10-CM | POA: Diagnosis not present

## 2021-02-02 DIAGNOSIS — D696 Thrombocytopenia, unspecified: Secondary | ICD-10-CM | POA: Diagnosis not present

## 2021-02-02 DIAGNOSIS — G2581 Restless legs syndrome: Secondary | ICD-10-CM | POA: Diagnosis not present

## 2021-04-18 ENCOUNTER — Other Ambulatory Visit: Payer: Self-pay

## 2021-04-19 ENCOUNTER — Other Ambulatory Visit: Payer: Self-pay

## 2021-04-19 ENCOUNTER — Inpatient Hospital Stay: Payer: HMO | Attending: Hematology | Admitting: Hematology

## 2021-04-19 ENCOUNTER — Inpatient Hospital Stay: Payer: HMO

## 2021-04-19 LAB — CMP (CANCER CENTER ONLY)
ALT: 27 U/L (ref 0–44)
AST: 18 U/L (ref 15–41)
Albumin: 3.9 g/dL (ref 3.5–5.0)
Alkaline Phosphatase: 70 U/L (ref 38–126)
Anion gap: 9 (ref 5–15)
BUN: 38 mg/dL — ABNORMAL HIGH (ref 8–23)
CO2: 23 mmol/L (ref 22–32)
Calcium: 9.4 mg/dL (ref 8.9–10.3)
Chloride: 110 mmol/L (ref 98–111)
Creatinine: 1.87 mg/dL — ABNORMAL HIGH (ref 0.61–1.24)
GFR, Estimated: 37 mL/min — ABNORMAL LOW (ref >60)
Glucose, Bld: 93 mg/dL (ref 70–99)
Potassium: 4.2 mmol/L (ref 3.5–5.1)
Sodium: 142 mmol/L (ref 135–145)
Total Bilirubin: 0.4 mg/dL (ref 0.3–1.2)
Total Protein: 6.5 g/dL (ref 6.5–8.1)

## 2021-04-19 LAB — CBC WITH DIFFERENTIAL (CANCER CENTER ONLY)
Abs Immature Granulocytes: 0.03 10*3/uL (ref 0.00–0.07)
Basophils Absolute: 0.1 10*3/uL (ref 0.0–0.1)
Basophils Relative: 1 %
Eosinophils Absolute: 0.4 10*3/uL (ref 0.0–0.5)
Eosinophils Relative: 6 %
HCT: 38.4 % — ABNORMAL LOW (ref 39.0–52.0)
Hemoglobin: 13.3 g/dL (ref 13.0–17.0)
Immature Granulocytes: 0 %
Lymphocytes Relative: 22 %
Lymphs Abs: 1.5 10*3/uL (ref 0.7–4.0)
MCH: 32.7 pg (ref 26.0–34.0)
MCHC: 34.6 g/dL (ref 30.0–36.0)
MCV: 94.3 fL (ref 80.0–100.0)
Monocytes Absolute: 0.6 10*3/uL (ref 0.1–1.0)
Monocytes Relative: 8 %
Neutro Abs: 4.3 10*3/uL (ref 1.7–7.7)
Neutrophils Relative %: 63 %
Platelet Count: 159 10*3/uL (ref 150–400)
RBC: 4.07 MIL/uL — ABNORMAL LOW (ref 4.22–5.81)
RDW: 12.2 % (ref 11.5–15.5)
WBC Count: 6.8 10*3/uL (ref 4.0–10.5)
nRBC: 0 % (ref 0.0–0.2)

## 2021-04-19 LAB — IRON AND TIBC
Iron: 80 ug/dL (ref 42–163)
Saturation Ratios: 27 % (ref 20–55)
TIBC: 298 ug/dL (ref 202–409)
UIBC: 217 ug/dL (ref 117–376)

## 2021-04-19 LAB — FERRITIN: Ferritin: 258 ng/mL (ref 24–336)

## 2021-04-20 ENCOUNTER — Telehealth: Payer: Self-pay | Admitting: Hematology

## 2021-04-20 NOTE — Telephone Encounter (Signed)
Left message with follow-up appointment per 12/8 los.

## 2021-04-25 ENCOUNTER — Encounter: Payer: Self-pay | Admitting: Hematology

## 2021-04-25 NOTE — Progress Notes (Addendum)
HEMATOLOGY/ONCOLOGY CLINIC NOTE  Date of Service: .04/19/2021   Patient Care Team: Sueanne Margarita, DO as PCP - General (Internal Medicine)  CHIEF COMPLAINTS/PURPOSE OF CONSULTATION:  Follow-up for continued management of iron overload disorder  HISTORY OF PRESENTING ILLNESS:  Please see previous note for details on initial presentation  INTERVAL HISTORY:   Andre Chen is a wonderful 80 y.o. male who is here for follow-up of his iron overload disorder.  Patient's last visit with [place] was on 10/18/2020.  Patient notes no acute new symptoms since his last clinic visit. Labs today show normal hemoglobin of 13.3 with normal WBC count and platelets CMP shows well-controlled blood sugar and stable creatinine of 1.87 and normal liver function tests Ferritin of 258 with an iron saturation of 27%. Patient notes that he has cut down his alcohol and red meat intake and that his blood sugars have been well controlled.  MEDICAL HISTORY:  Past Medical History:  Diagnosis Date   Cataracts, bilateral    Colon polyps    Diabetes mellitus without complication (HCC)    Gout    Hyperlipemia    Hypertension    Obesity   Benign Prostatic Hyperplasia BCC  SCC   SURGICAL HISTORY: Past Surgical History:  Procedure Laterality Date   APPENDECTOMY  1957   CATARACT EXTRACTION W/ INTRAOCULAR LENS  IMPLANT, BILATERAL  1/15 and 2/15   COLONOSCOPY  2004    SOCIAL HISTORY: Social History   Socioeconomic History   Marital status: Married    Spouse name: Not on file   Number of children: Not on file   Years of education: Not on file   Highest education level: Not on file  Occupational History   Not on file  Tobacco Use   Smoking status: Former    Types: Cigarettes    Quit date: 05/13/1972    Years since quitting: 48.9   Smokeless tobacco: Former    Quit date: 01/22/2000  Substance and Sexual Activity   Alcohol use: No   Drug use: No   Sexual activity: Not on file  Other Topics  Concern   Not on file  Social History Narrative   Not on file   Social Determinants of Health   Financial Resource Strain: Not on file  Food Insecurity: Not on file  Transportation Needs: Not on file  Physical Activity: Not on file  Stress: Not on file  Social Connections: Not on file  Intimate Partner Violence: Not on file    FAMILY HISTORY: Family History  Problem Relation Age of Onset   Colon cancer Neg Hx    Esophageal cancer Neg Hx    Rectal cancer Neg Hx    Stomach cancer Neg Hx     ALLERGIES:  has No Known Allergies.  MEDICATIONS:  Current Outpatient Medications  Medication Sig Dispense Refill   Alcohol Swabs ([employer] PREP) 70 % PADS Apply 1 each topically 3 (three) times daily.     atenolol-chlorthalidone (TENORETIC) 100-25 MG per tablet Take 1 tablet by mouth daily.     colchicine 0.6 MG tablet Take 0.6 mg by mouth every other day.      doxazosin (CARDURA) 4 MG tablet Take 4 mg by mouth daily.     etodolac (LODINE) 400 MG tablet Take 400 mg by mouth as needed.      Febuxostat (ULORIC) 80 MG TABS Take 40 mg by mouth daily.      losartan (COZAAR) 100 MG tablet Take 50 mg by mouth  daily.     magnesium hydroxide (MILK OF MAGNESIA) 400 MG/5ML suspension Take 30 mLs by mouth daily as needed for mild constipation.     OneTouch Delica Lancets 37T MISC Apply 1 each topically 3 (three) times daily.     ONETOUCH VERIO test strip 1 each daily.     OZEMPIC, 0.25 OR 0.5 MG/DOSE, 2 MG/1.5ML SOPN  (Patient not taking: Reported on 04/19/2020)     polyethylene glycol (MIRALAX / GLYCOLAX) 17 g packet Take 17 g by mouth daily. (Patient not taking: Reported on 10/13/6946)     TRULICITY 1.5 NI/6.2VO SOPN Inject into the skin.     No current facility-administered medications for this visit.    REVIEW OF SYSTEMS:   .10 Point review of Systems was done is negative except as noted above.   PHYSICAL EXAMINATION: ECOG PERFORMANCE STATUS: 0 - Asymptomatic  . Vitals:   04/19/21 1445   BP: (!) 158/62  Pulse: 68  Resp: 18  Temp: 97.9 F (36.6 C)  SpO2: 99%   Filed Weights   04/19/21 1445  Weight: 206 lb (93.4 kg)   .Body mass index is 29.98 kg/m.  Marland Kitchen GENERAL:alert, in no acute distress and comfortable SKIN: no acute rashes, no significant lesions EYES: conjunctiva are pink and non-injected, sclera anicteric OROPHARYNX: MMM, no exudates, no oropharyngeal erythema or ulceration NECK: supple, no JVD LYMPH:  no palpable lymphadenopathy in the cervical, axillary or inguinal regions LUNGS: clear to auscultation b/l with normal respiratory effort HEART: regular rate & rhythm ABDOMEN:  normoactive bowel sounds , non tender, not distended. Extremity: no pedal edema PSYCH: alert & oriented x 3 with fluent speech NEURO: no focal motor/sensory deficits   LABORATORY DATA:  I have reviewed the data as listed  . CBC Latest Ref Rng & Units 04/19/2021 10/18/2020 04/19/2020  WBC 4.0 - 10.5 K/uL 6.8 7.2 7.3  Hemoglobin 13.0 - 17.0 g/dL 13.3 13.8 14.7  Hematocrit 39.0 - 52.0 % 38.4(L) 40.6 44.0  Platelets 150 - 400 K/uL 159 143(L) 157    . CMP Latest Ref Rng & Units 04/19/2021 10/18/2020 04/19/2020  Glucose 70 - 99 mg/dL 93 102(H) 107(H)  BUN 8 - 23 mg/dL 38(H) 43(H) 43(H)  Creatinine 0.61 - 1.24 mg/dL 1.87(H) 2.07(H) 2.15(H)  Sodium 135 - 145 mmol/L 142 143 140  Potassium 3.5 - 5.1 mmol/L 4.2 4.3 4.5  Chloride 98 - 111 mmol/L 110 109 104  CO2 22 - 32 mmol/L 23 24 29   Calcium 8.9 - 10.3 mg/dL 9.4 9.8 10.4(H)  Total Protein 6.5 - 8.1 g/dL 6.5 6.9 7.3  Total Bilirubin 0.3 - 1.2 mg/dL 0.4 0.3 0.5  Alkaline Phos 38 - 126 U/L 70 81 72  AST 15 - 41 U/L 18 17 23   ALT 0 - 44 U/L 27 25 31    . Lab Results  Component Value Date   IRON 80 04/19/2021   TIBC 298 04/19/2021   IRONPCTSAT 27 04/19/2021   (Iron and TIBC)  Lab Results  Component Value Date   FERRITIN 258 04/19/2021     07/21/2019:   RADIOGRAPHIC STUDIES: I have personally reviewed the radiological images  as listed and agreed with the findings in the report. No results found.  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  12/29/2017 [place] Abd ([id number]) revealed "1. Known renal cysts are not well visualized by ultrasound. 2. Mildly heterogeneous  liver may reflect underlying liver disease."  07/21/2019 Hemochromatosis shows "Single Mutation Identified (H63D)" 07/28/2019 [place] Abd ([id number]) revealed "No evidence of cholelithiasis or biliary ductal dilatation. Diffuse hepatocellular disease, similar in appearance to prior study. No hepatic mass visualized."  PLAN: -Discussed pt labwork todaLabs today show normal hemoglobin of 13.3 with normal WBC count and platelets CMP shows well-controlled blood sugar and stable creatinine of 1.87 and normal liver function tests Ferritin of 258 with an iron saturation of 27%. Patient notes that he has cut down his alcohol and red meat intake and that his blood sugars have been well controlled. -Since he has orthostasis and tolerates therapeutic phlebotomies poorly and since he does need higher ferritin levels in the context of chronic kidney disease to avoid anemia we are worsening therapeutic phlebotomies only ferritin is more than 300. -Risk ferritin is closer to 250 and there is no indication for therapeutic phlebotomy at this time.  FOLLOW UP: Labs with PCP in 6 months RTC with Dr Irene Limbo with labs in 12 months   All of the patient's questions were answered with apparent satisfaction. The patient knows to call the clinic with any problems, questions or concerns.    Sullivan Lone MD [place] AAHIVMS [facility] Hematology/Oncology Physician [facility]

## 2021-05-11 DIAGNOSIS — E1169 Type 2 diabetes mellitus with other specified complication: Secondary | ICD-10-CM | POA: Diagnosis not present

## 2021-05-11 DIAGNOSIS — I10 Essential (primary) hypertension: Secondary | ICD-10-CM | POA: Diagnosis not present

## 2021-05-11 DIAGNOSIS — M1990 Unspecified osteoarthritis, unspecified site: Secondary | ICD-10-CM | POA: Diagnosis not present

## 2021-05-11 DIAGNOSIS — N1831 Chronic kidney disease, stage 3a: Secondary | ICD-10-CM | POA: Diagnosis not present

## 2021-05-31 IMAGING — US US ABDOMEN LIMITED W/ ELASTOGRAPHY
1 series · 12 of 25 positions shown · non-contrast
Comparison: 12/29/2017

CLINICAL DATA: Elevated liver function tests. Iron overload and
hemosiderosis.

EXAM:
US ABDOMEN LIMITED - RIGHT UPPER QUADRANT
ULTRASOUND HEPATIC ELASTOGRAPHY
TECHNIQUE: Sonography of the right upper quadrant was performed. In addition,
ultrasound elastography evaluation of the liver was performed. A
region of interest was placed within the right lobe of the liver.
Following application of a compressive sonographic pulse, tissue
compressibility was assessed. Multiple assessments were performed at
the selected site. Median tissue compressibility was determined.
Previously, hepatic stiffness was assessed by shear wave velocity.
Based on recently published Society of Radiologists in Ultrasound
consensus article, reporting is now recommended to be performed in
the SI units of pressure (kiloPascals) representing hepatic
stiffness/elasticity. The obtained result is compared to the
published reference standards. (cACLD = compensated Advanced Chronic
Liver Disease)

[Series 1: us abdomen limited w/ elastography · 12 of 72 slices shown]
[im 3/72]
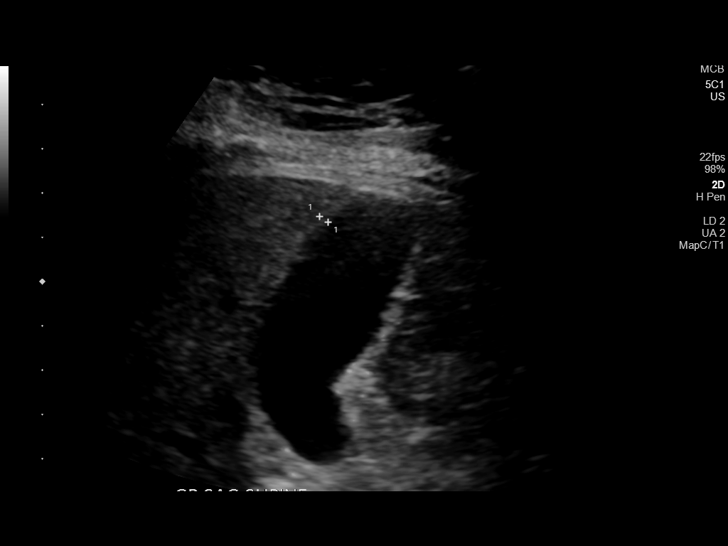
[im 9/72]
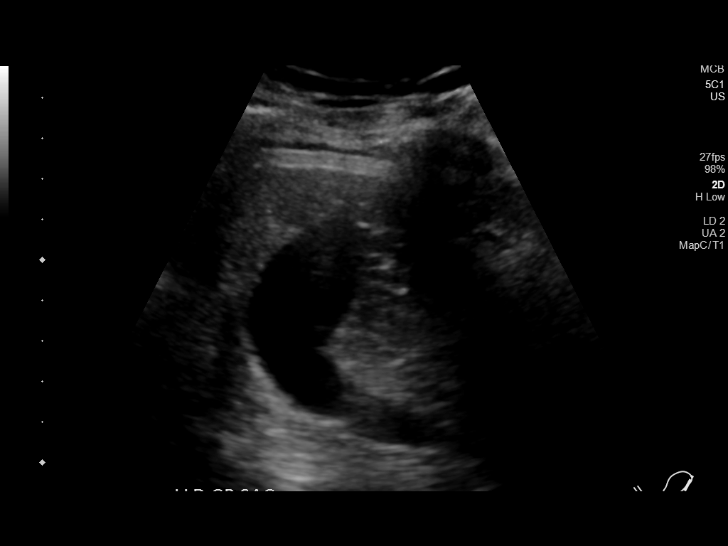
[im 15/72]
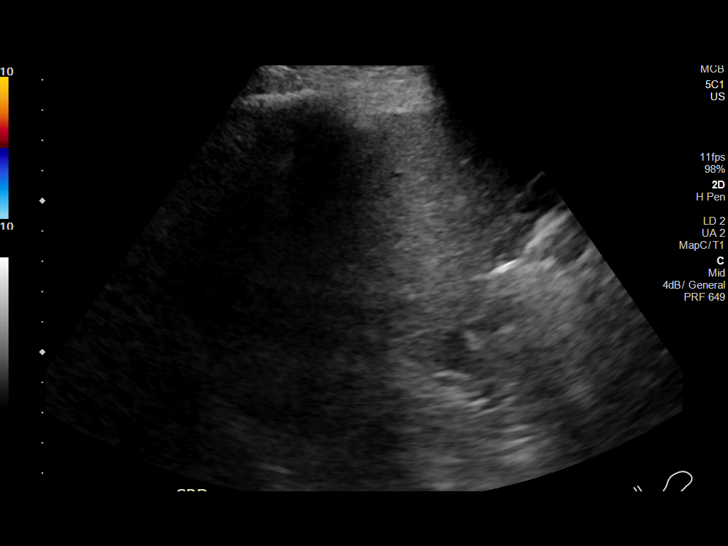
[im 21/72]
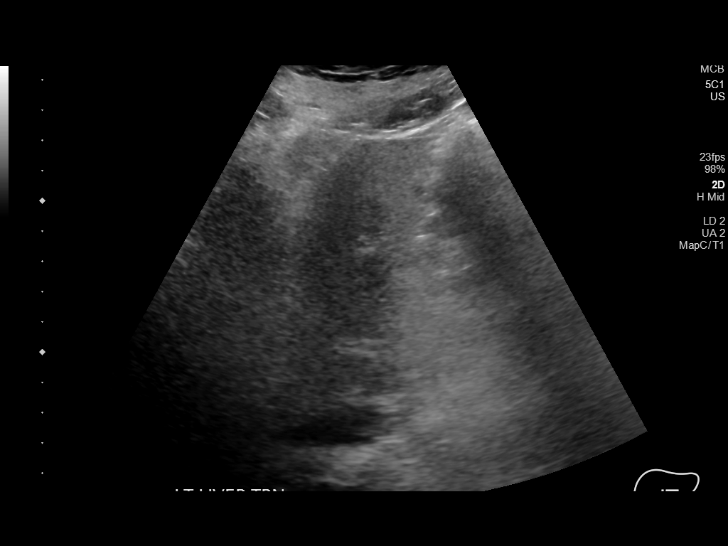
[im 27/72]
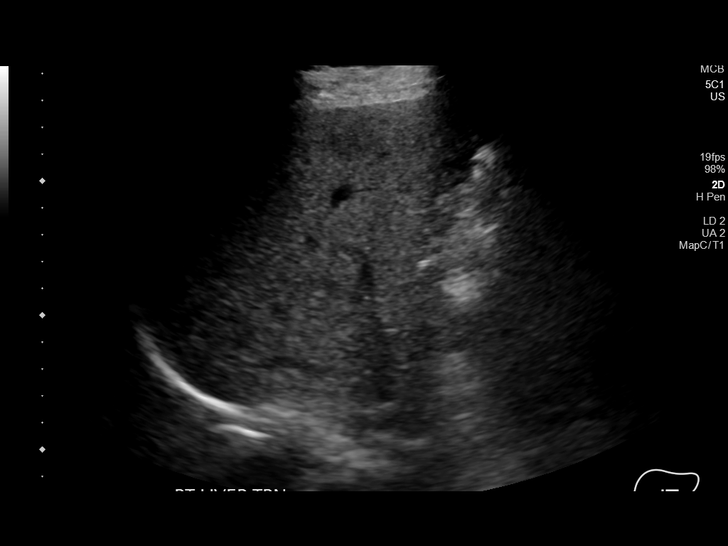
[im 33/72]
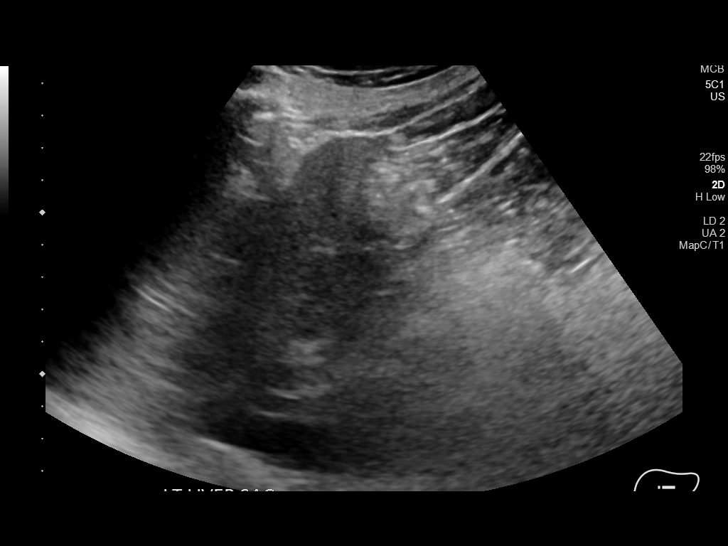
[im 39/72]
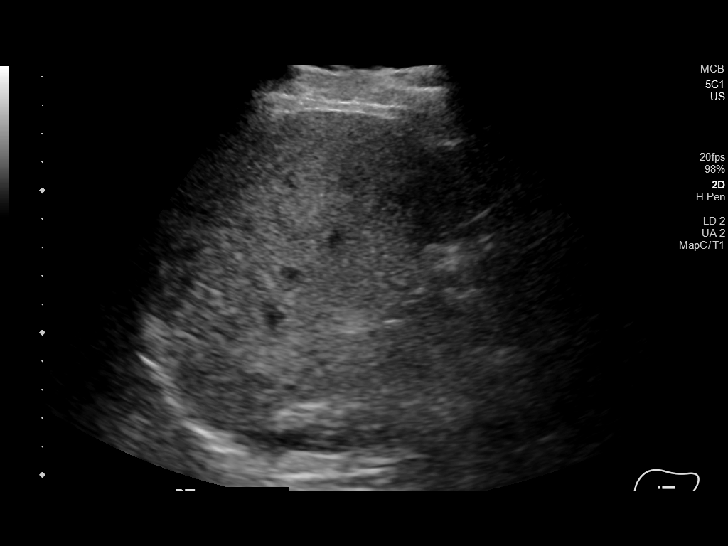
[im 45/72]
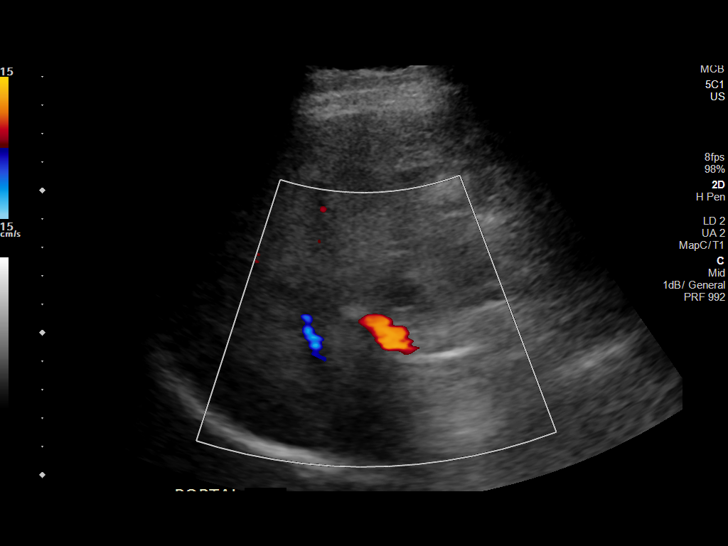
[im 51/72]
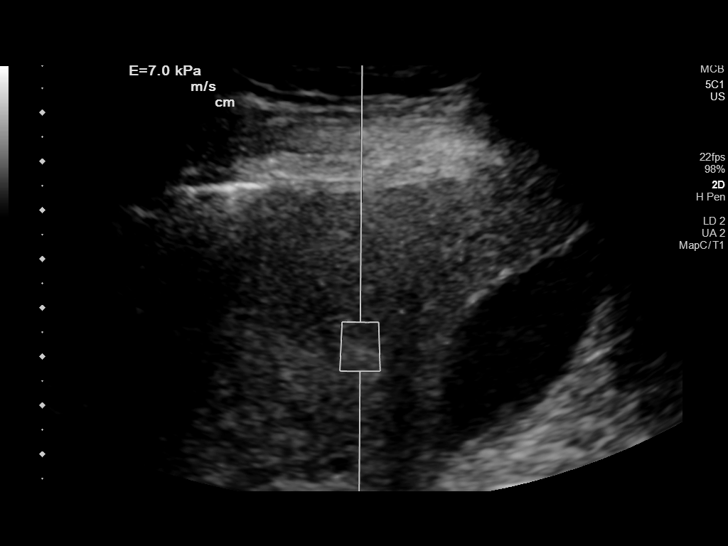
[im 57/72]
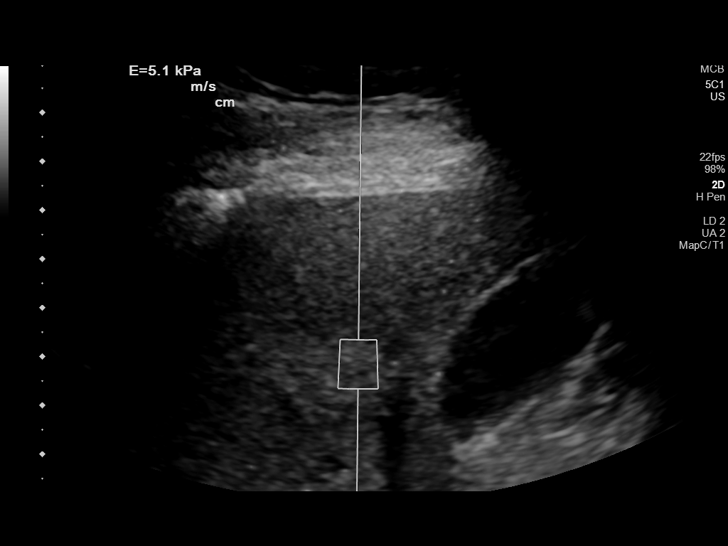
[im 63/72]
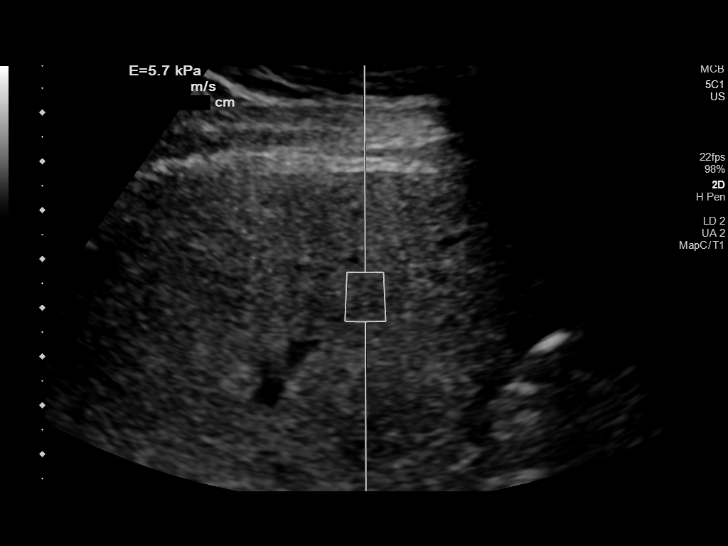
[im 69/72]
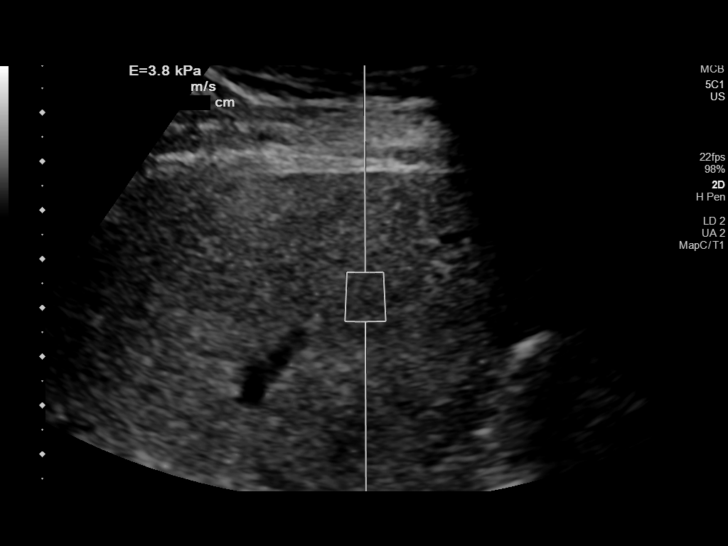

[12 of 25 positions shown; findings below may reference images not displayed]

FINDINGS: ULTRASOUND ABDOMEN LIMITED RIGHT UPPER QUADRANT

Gallbladder:

No gallstones or wall thickening visualized. No sonographic Murphy
sign noted.

Common bile duct:

Diameter: 3 mm, within normal limits.

Liver:

Diffusely increased and coarsened hepatic echotexture is similar to
prior exam, consistent with diffuse hepatocellular disease. No focal
liver masses identified. Portal vein is patent on color Doppler
imaging with normal direction of blood flow towards the liver.

ULTRASOUND HEPATIC ELASTOGRAPHY

Device: Siemens Helix VTQ

Patient position: Oblique

Transducer 5C1

Number of measurements: 10

Hepatic segment:  8

Median kPa:

IQR:

IQR/Median kPa ratio:

Data quality:  Good

Diagnostic category:  < or = 5 kPa: high probability of being normal
IMPRESSION: ULTRASOUND RUQ:

No evidence of cholelithiasis or biliary ductal dilatation.

Diffuse hepatocellular disease, similar in appearance to prior
study. No hepatic mass visualized.

ULTRASOUND HEPATIC ELASTOGRAPHY:

Median kPa:

Diagnostic category:  < or = 5 kPa: high probability of being normal

The use of hepatic elastography is applicable to patients with viral
hepatitis and non-alcoholic fatty liver disease. At this time, there
is insufficient data for the referenced cut-off values and use in
other causes of liver disease, including alcoholic liver disease.
Patients, however, may be assessed by elastography and serve as
their own reference standard/baseline.

In patients with non-alcoholic liver disease, the values suggesting
compensated advanced chronic liver disease (cACLD) may be lower, and
patients may need additional testing with elasticity results of [DATE]
kPa.

Please note that abnormal hepatic elasticity and shear wave
velocities may also be identified in clinical settings other than
with hepatic fibrosis, such as: acute hepatitis, elevated right
heart and central venous pressures including use of beta blockers,
Tiger disease (Marieli), infiltrative processes such as
mastocytosis/amyloidosis/infiltrative tumor/lymphoma, extrahepatic
cholestasis, with hyperemia in the post-prandial state, and with
liver transplantation. Correlation with patient history, laboratory
data, and clinical condition recommended.

Diagnostic Categories:

< or =5 kPa: high probability of being normal

< or =9 kPa: in the absence of other known clinical signs, rules [DATE] kPa and ?13 kPa: suggestive of cACLD, but needs further testing

>13 kPa: highly suggestive of cACLD

> or =17 kPa: highly suggestive of cACLD with an increased
probability of clinically significant portal hypertension

## 2021-06-10 DIAGNOSIS — E1169 Type 2 diabetes mellitus with other specified complication: Secondary | ICD-10-CM | POA: Diagnosis not present

## 2021-06-10 DIAGNOSIS — I10 Essential (primary) hypertension: Secondary | ICD-10-CM | POA: Diagnosis not present

## 2021-06-10 DIAGNOSIS — N1831 Chronic kidney disease, stage 3a: Secondary | ICD-10-CM | POA: Diagnosis not present

## 2021-06-10 DIAGNOSIS — M1990 Unspecified osteoarthritis, unspecified site: Secondary | ICD-10-CM | POA: Diagnosis not present

## 2021-07-10 DIAGNOSIS — M1990 Unspecified osteoarthritis, unspecified site: Secondary | ICD-10-CM | POA: Diagnosis not present

## 2021-07-10 DIAGNOSIS — I10 Essential (primary) hypertension: Secondary | ICD-10-CM | POA: Diagnosis not present

## 2021-07-10 DIAGNOSIS — N1831 Chronic kidney disease, stage 3a: Secondary | ICD-10-CM | POA: Diagnosis not present

## 2021-07-10 DIAGNOSIS — E1169 Type 2 diabetes mellitus with other specified complication: Secondary | ICD-10-CM | POA: Diagnosis not present

## 2021-07-20 ENCOUNTER — Telehealth: Payer: Self-pay | Admitting: Hematology

## 2021-07-20 NOTE — Telephone Encounter (Signed)
Rescheduled upcoming appointment due to provider's schedule. Mailed calendar. ?

## 2021-08-06 DIAGNOSIS — M8589 Other specified disorders of bone density and structure, multiple sites: Secondary | ICD-10-CM | POA: Diagnosis not present

## 2021-08-10 DIAGNOSIS — N1831 Chronic kidney disease, stage 3a: Secondary | ICD-10-CM | POA: Diagnosis not present

## 2021-08-10 DIAGNOSIS — E1169 Type 2 diabetes mellitus with other specified complication: Secondary | ICD-10-CM | POA: Diagnosis not present

## 2021-08-10 DIAGNOSIS — I10 Essential (primary) hypertension: Secondary | ICD-10-CM | POA: Diagnosis not present

## 2021-08-10 DIAGNOSIS — M1990 Unspecified osteoarthritis, unspecified site: Secondary | ICD-10-CM | POA: Diagnosis not present

## 2021-08-15 DIAGNOSIS — M109 Gout, unspecified: Secondary | ICD-10-CM | POA: Diagnosis not present

## 2021-08-15 DIAGNOSIS — I10 Essential (primary) hypertension: Secondary | ICD-10-CM | POA: Diagnosis not present

## 2021-08-15 DIAGNOSIS — E1169 Type 2 diabetes mellitus with other specified complication: Secondary | ICD-10-CM | POA: Diagnosis not present

## 2021-08-15 DIAGNOSIS — Z125 Encounter for screening for malignant neoplasm of prostate: Secondary | ICD-10-CM | POA: Diagnosis not present

## 2021-08-22 DIAGNOSIS — M109 Gout, unspecified: Secondary | ICD-10-CM | POA: Diagnosis not present

## 2021-08-22 DIAGNOSIS — M8580 Other specified disorders of bone density and structure, unspecified site: Secondary | ICD-10-CM | POA: Diagnosis not present

## 2021-08-22 DIAGNOSIS — D696 Thrombocytopenia, unspecified: Secondary | ICD-10-CM | POA: Diagnosis not present

## 2021-08-22 DIAGNOSIS — R82998 Other abnormal findings in urine: Secondary | ICD-10-CM | POA: Diagnosis not present

## 2021-08-22 DIAGNOSIS — M1990 Unspecified osteoarthritis, unspecified site: Secondary | ICD-10-CM | POA: Diagnosis not present

## 2021-08-22 DIAGNOSIS — Z0000 Encounter for general adult medical examination without abnormal findings: Secondary | ICD-10-CM | POA: Diagnosis not present

## 2021-08-22 DIAGNOSIS — Z1212 Encounter for screening for malignant neoplasm of rectum: Secondary | ICD-10-CM | POA: Diagnosis not present

## 2021-08-22 DIAGNOSIS — L989 Disorder of the skin and subcutaneous tissue, unspecified: Secondary | ICD-10-CM | POA: Diagnosis not present

## 2021-08-22 DIAGNOSIS — E1169 Type 2 diabetes mellitus with other specified complication: Secondary | ICD-10-CM | POA: Diagnosis not present

## 2021-08-22 DIAGNOSIS — I10 Essential (primary) hypertension: Secondary | ICD-10-CM | POA: Diagnosis not present

## 2021-08-22 DIAGNOSIS — D692 Other nonthrombocytopenic purpura: Secondary | ICD-10-CM | POA: Diagnosis not present

## 2021-08-22 DIAGNOSIS — N1831 Chronic kidney disease, stage 3a: Secondary | ICD-10-CM | POA: Diagnosis not present

## 2021-08-23 DIAGNOSIS — N1831 Chronic kidney disease, stage 3a: Secondary | ICD-10-CM | POA: Diagnosis not present

## 2021-08-23 DIAGNOSIS — M8580 Other specified disorders of bone density and structure, unspecified site: Secondary | ICD-10-CM | POA: Diagnosis not present

## 2021-11-09 DIAGNOSIS — E1169 Type 2 diabetes mellitus with other specified complication: Secondary | ICD-10-CM | POA: Diagnosis not present

## 2021-11-09 DIAGNOSIS — I10 Essential (primary) hypertension: Secondary | ICD-10-CM | POA: Diagnosis not present

## 2021-11-09 DIAGNOSIS — M109 Gout, unspecified: Secondary | ICD-10-CM | POA: Diagnosis not present

## 2021-11-23 DIAGNOSIS — E559 Vitamin D deficiency, unspecified: Secondary | ICD-10-CM | POA: Diagnosis not present

## 2021-11-23 DIAGNOSIS — D692 Other nonthrombocytopenic purpura: Secondary | ICD-10-CM | POA: Diagnosis not present

## 2021-11-23 DIAGNOSIS — L989 Disorder of the skin and subcutaneous tissue, unspecified: Secondary | ICD-10-CM | POA: Diagnosis not present

## 2021-11-23 DIAGNOSIS — I10 Essential (primary) hypertension: Secondary | ICD-10-CM | POA: Diagnosis not present

## 2021-11-23 DIAGNOSIS — E1169 Type 2 diabetes mellitus with other specified complication: Secondary | ICD-10-CM | POA: Diagnosis not present

## 2021-11-23 DIAGNOSIS — M109 Gout, unspecified: Secondary | ICD-10-CM | POA: Diagnosis not present

## 2021-11-23 DIAGNOSIS — M1990 Unspecified osteoarthritis, unspecified site: Secondary | ICD-10-CM | POA: Diagnosis not present

## 2021-11-23 DIAGNOSIS — M8580 Other specified disorders of bone density and structure, unspecified site: Secondary | ICD-10-CM | POA: Diagnosis not present

## 2021-11-23 DIAGNOSIS — E785 Hyperlipidemia, unspecified: Secondary | ICD-10-CM | POA: Diagnosis not present

## 2021-11-23 DIAGNOSIS — L219 Seborrheic dermatitis, unspecified: Secondary | ICD-10-CM | POA: Diagnosis not present

## 2021-11-23 DIAGNOSIS — N1831 Chronic kidney disease, stage 3a: Secondary | ICD-10-CM | POA: Diagnosis not present

## 2022-04-19 ENCOUNTER — Other Ambulatory Visit: Payer: Self-pay

## 2022-04-22 ENCOUNTER — Ambulatory Visit: Payer: HMO | Admitting: Hematology

## 2022-04-22 ENCOUNTER — Other Ambulatory Visit: Payer: Self-pay

## 2022-04-22 ENCOUNTER — Other Ambulatory Visit: Payer: HMO

## 2022-04-22 ENCOUNTER — Inpatient Hospital Stay: Payer: HMO | Admitting: Hematology

## 2022-04-22 ENCOUNTER — Inpatient Hospital Stay: Payer: HMO | Attending: Hematology

## 2022-04-22 LAB — CMP (CANCER CENTER ONLY)
ALT: 23 U/L (ref 0–44)
AST: 16 U/L (ref 15–41)
Albumin: 4 g/dL (ref 3.5–5.0)
Alkaline Phosphatase: 82 U/L (ref 38–126)
Anion gap: 6 (ref 5–15)
BUN: 33 mg/dL — ABNORMAL HIGH (ref 8–23)
CO2: 29 mmol/L (ref 22–32)
Calcium: 10.3 mg/dL (ref 8.9–10.3)
Chloride: 108 mmol/L (ref 98–111)
Creatinine: 1.7 mg/dL — ABNORMAL HIGH (ref 0.61–1.24)
GFR, Estimated: 41 mL/min — ABNORMAL LOW (ref >60)
Glucose, Bld: 100 mg/dL — ABNORMAL HIGH (ref 70–99)
Potassium: 4.2 mmol/L (ref 3.5–5.1)
Sodium: 143 mmol/L (ref 135–145)
Total Bilirubin: 0.4 mg/dL (ref 0.3–1.2)
Total Protein: 6.4 g/dL — ABNORMAL LOW (ref 6.5–8.1)

## 2022-04-22 LAB — CBC WITH DIFFERENTIAL (CANCER CENTER ONLY)
Abs Immature Granulocytes: 0.02 10*3/uL (ref 0.00–0.07)
Basophils Absolute: 0 10*3/uL (ref 0.0–0.1)
Basophils Relative: 1 %
Eosinophils Absolute: 0.5 10*3/uL (ref 0.0–0.5)
Eosinophils Relative: 6 %
HCT: 39.3 % (ref 39.0–52.0)
Hemoglobin: 13.2 g/dL (ref 13.0–17.0)
Immature Granulocytes: 0 %
Lymphocytes Relative: 17 %
Lymphs Abs: 1.3 10*3/uL (ref 0.7–4.0)
MCH: 32.9 pg (ref 26.0–34.0)
MCHC: 33.6 g/dL (ref 30.0–36.0)
MCV: 98 fL (ref 80.0–100.0)
Monocytes Absolute: 0.6 10*3/uL (ref 0.1–1.0)
Monocytes Relative: 9 %
Neutro Abs: 4.9 10*3/uL (ref 1.7–7.7)
Neutrophils Relative %: 67 %
Platelet Count: 146 10*3/uL — ABNORMAL LOW (ref 150–400)
RBC: 4.01 MIL/uL — ABNORMAL LOW (ref 4.22–5.81)
RDW: 12.3 % (ref 11.5–15.5)
WBC Count: 7.3 10*3/uL (ref 4.0–10.5)
nRBC: 0 % (ref 0.0–0.2)

## 2022-04-22 LAB — IRON AND IRON BINDING CAPACITY (CC-WL,HP ONLY)
Iron: 60 ug/dL (ref 45–182)
Saturation Ratios: 20 % (ref 17.9–39.5)
TIBC: 295 ug/dL (ref 250–450)
UIBC: 235 ug/dL (ref 117–376)

## 2022-04-22 LAB — FERRITIN: Ferritin: 232 ng/mL (ref 24–336)

## 2022-04-22 NOTE — Progress Notes (Signed)
HEMATOLOGY/ONCOLOGY CLINIC NOTE  Date of Service: 04/22/22  Patient Care Team: Sueanne Margarita, DO as PCP - General (Internal Medicine)  CHIEF COMPLAINTS/PURPOSE OF CONSULTATION:  Follow-up for continued management of iron overload disorder  HISTORY OF PRESENTING ILLNESS:  Please see previous note for details on initial presentation  INTERVAL HISTORY:   Andre Chen is a wonderful 81 y.o. male who is here for follow-up of his iron overload disorder. He is here with his wife during today's visit.   Patient was last seen by me on 04/19/2021 and was doing well overall.   Patient reports he is doing well without any new medical concerns since our last visit. He notes he does not drink alcohol anymore and only eats red meat once a while. Patient denies shortness of breath, chest pain, abnormal bowl moment, abdominal pain, back pain, or leg swelling. Patient notes his gouts are stable.   He is complaint with all of his medication.   Patient notes he regularly follows-up with his PCP.  MEDICAL HISTORY:  Past Medical History:  Diagnosis Date   Cataracts, bilateral    Colon polyps    Diabetes mellitus without complication (HCC)    Gout    Hyperlipemia    Hypertension    Obesity   Benign Prostatic Hyperplasia BCC  SCC   SURGICAL HISTORY: Past Surgical History:  Procedure Laterality Date   APPENDECTOMY  1957   CATARACT EXTRACTION W/ INTRAOCULAR LENS  IMPLANT, BILATERAL  1/15 and 2/15   COLONOSCOPY  2004    SOCIAL HISTORY: Social History   Socioeconomic History   Marital status: Married    Spouse name: Not on file   Number of children: Not on file   Years of education: Not on file   Highest education level: Not on file  Occupational History   Not on file  Tobacco Use   Smoking status: Former    Types: Cigarettes    Quit date: 05/13/1972    Years since quitting: 49.9   Smokeless tobacco: Former    Quit date: 01/22/2000  Substance and Sexual Activity   Alcohol  use: No   Drug use: No   Sexual activity: Not on file  Other Topics Concern   Not on file  Social History Narrative   Not on file   Social Determinants of Health   Financial Resource Strain: Not on file  Food Insecurity: Not on file  Transportation Needs: Not on file  Physical Activity: Not on file  Stress: Not on file  Social Connections: Not on file  Intimate Partner Violence: Not on file    FAMILY HISTORY: Family History  Problem Relation Age of Onset   Colon cancer Neg Hx    Esophageal cancer Neg Hx    Rectal cancer Neg Hx    Stomach cancer Neg Hx     ALLERGIES:  has No Known Allergies.  MEDICATIONS:  Current Outpatient Medications  Medication Sig Dispense Refill   Alcohol Swabs ([employer] PREP) 70 % PADS Apply 1 each topically 3 (three) times daily.     atenolol-chlorthalidone (TENORETIC) 100-25 MG per tablet Take 1 tablet by mouth daily.     colchicine 0.6 MG tablet Take 0.6 mg by mouth every other day.      doxazosin (CARDURA) 4 MG tablet Take 4 mg by mouth daily.     etodolac (LODINE) 400 MG tablet Take 400 mg by mouth as needed.      Febuxostat (ULORIC) 80 MG TABS Take 40  mg by mouth daily.      losartan (COZAAR) 100 MG tablet Take 50 mg by mouth daily.     magnesium hydroxide (MILK OF MAGNESIA) 400 MG/5ML suspension Take 30 mLs by mouth daily as needed for mild constipation.     OneTouch Delica Lancets 79U MISC Apply 1 each topically 3 (three) times daily.     ONETOUCH VERIO test strip 1 each daily.     OZEMPIC, 0.25 OR 0.5 MG/DOSE, 2 MG/1.5ML SOPN  (Patient not taking: Reported on 04/19/2020)     polyethylene glycol (MIRALAX / GLYCOLAX) 17 g packet Take 17 g by mouth daily. (Patient not taking: Reported on 38/07/3381)     TRULICITY 1.5 AN/1.9TY SOPN Inject into the skin.     No current facility-administered medications for this visit.    REVIEW OF SYSTEMS:   .10 Point review of Systems was done is negative except as noted above.   PHYSICAL EXAMINATION: ECOG  PERFORMANCE STATUS: 0 - Asymptomatic  . Vitals:   04/22/22 1328  BP: (!) 150/67  Pulse: 67  Resp: 16  Temp: 97.6 F (36.4 C)  SpO2: 100%    Filed Weights   04/22/22 1328  Weight: 202 lb 9.6 oz (91.9 kg)    .Body mass index is 29.49 kg/m.  Marland Kitchen GENERAL:alert, in no acute distress and comfortable SKIN: no acute rashes, no significant lesions EYES: conjunctiva are pink and non-injected, sclera anicteric OROPHARYNX: MMM, no exudates, no oropharyngeal erythema or ulceration NECK: supple, no JVD LYMPH:  no palpable lymphadenopathy in the cervical, axillary or inguinal regions LUNGS: clear to auscultation b/l with normal respiratory effort HEART: regular rate & rhythm ABDOMEN:  normoactive bowel sounds , non tender, not distended. Extremity: no pedal edema PSYCH: alert & oriented x 3 with fluent speech NEURO: no focal motor/sensory deficits   LABORATORY DATA:  I have reviewed the data as listed  .    Latest Ref Rng & Units 04/22/2022    1:12 PM 04/19/2021    1:40 PM 10/18/2020   12:37 PM  CBC  WBC 4.0 - 10.5 K/uL 7.3  6.8  7.2   Hemoglobin 13.0 - 17.0 g/dL 13.2  13.3  13.8   Hematocrit 39.0 - 52.0 % 39.3  38.4  40.6   Platelets 150 - 400 K/uL 146  159  143     .    Latest Ref Rng & Units 04/22/2022    1:12 PM 04/19/2021    1:40 PM 10/18/2020   12:37 PM  CMP  Glucose 70 - 99 mg/dL 100  93  102   BUN 8 - 23 mg/dL 33  38  43   Creatinine 0.61 - 1.24 mg/dL 1.70  1.87  2.07   Sodium 135 - 145 mmol/L 143  142  143   Potassium 3.5 - 5.1 mmol/L 4.2  4.2  4.3   Chloride 98 - 111 mmol/L 108  110  109   CO2 22 - 32 mmol/L '29  23  24   '$ Calcium 8.9 - 10.3 mg/dL 10.3  9.4  9.8   Total Protein 6.5 - 8.1 g/dL 6.4  6.5  6.9   Total Bilirubin 0.3 - 1.2 mg/dL 0.4  0.4  0.3   Alkaline Phos 38 - 126 U/L 82  70  81   AST 15 - 41 U/L '16  18  17   '$ ALT 0 - 44 U/L '23  27  25    '$ . Lab Results  Component Value Date  IRON 60 04/22/2022   TIBC 295 04/22/2022   IRONPCTSAT 20 04/22/2022    (Iron and TIBC)  Lab Results  Component Value Date   FERRITIN 232 04/22/2022     07/21/2019:   RADIOGRAPHIC STUDIES: I have personally reviewed the radiological images as listed and agreed with the findings in the report. No results found.  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  12/29/2017 [place] Abd ([id number]) revealed "1. Known renal cysts are not well visualized by ultrasound. 2. Mildly heterogeneous liver may reflect underlying liver disease."  07/21/2019 Hemochromatosis shows "Single Mutation Identified (H63D)" 07/28/2019 [place] Abd ([id number]) revealed "No evidence of cholelithiasis or biliary ductal dilatation. Diffuse hepatocellular disease, similar in appearance to prior study. No hepatic mass visualized."  PLAN: -Discussed lab results from today with the patient. CBC stable, but platelets of 146 K. CMP stable, with creatinine of 1.70.  Ferritin 232 with iron saturation of 20% -Recommend staying hydrated.  -Patient is not drinking alcohol anymore and is trying to cut down red meat intake.  -Patient will have labs done with his PCP from now.  [facility] of phlebotomy unless Ferritin is above 500. Allowing for some permissive increase in ferritin levels in the context of the patients CKD and poor tolerance of therapeutic phlebotomies and carrier status of HFE gene mutation.  FOLLOW UP: RTC with Dr Irene Limbo as needed for ferritin>500  The total time spent in the appointment was 20 minutes* .  All of the patient's questions were answered with apparent satisfaction. The patient knows to call the clinic with any problems, questions or concerns.   Sullivan Lone MD MS AAHIVMS [facility] Hematology/Oncology Physician [facility]  .*Total Encounter Time as defined by the Centers for Medicare and Medicaid Services includes, in addition to the  face-to-face time of a patient visit (documented in the note above) non-face-to-face time: obtaining and reviewing outside history, ordering and reviewing medications, tests or procedures, care coordination (communications with other health care professionals or caregivers) and documentation in the medical record.   I, Andre Chen, am acting as a Education administrator for Sullivan Lone, MD.  .I have reviewed the above documentation for accuracy and completeness, and I agree with the above. Brunetta Genera MD

## 2022-04-28 ENCOUNTER — Encounter: Payer: Self-pay | Admitting: Hematology

## 2022-08-29 DIAGNOSIS — M109 Gout, unspecified: Secondary | ICD-10-CM | POA: Diagnosis not present

## 2022-08-29 DIAGNOSIS — E785 Hyperlipidemia, unspecified: Secondary | ICD-10-CM | POA: Diagnosis not present

## 2022-08-29 DIAGNOSIS — I10 Essential (primary) hypertension: Secondary | ICD-10-CM | POA: Diagnosis not present

## 2022-08-29 DIAGNOSIS — R7989 Other specified abnormal findings of blood chemistry: Secondary | ICD-10-CM | POA: Diagnosis not present

## 2022-08-29 DIAGNOSIS — E1169 Type 2 diabetes mellitus with other specified complication: Secondary | ICD-10-CM | POA: Diagnosis not present

## 2022-09-05 DIAGNOSIS — Z1339 Encounter for screening examination for other mental health and behavioral disorders: Secondary | ICD-10-CM | POA: Diagnosis not present

## 2022-09-05 DIAGNOSIS — Z1331 Encounter for screening for depression: Secondary | ICD-10-CM | POA: Diagnosis not present

## 2022-09-05 DIAGNOSIS — M1990 Unspecified osteoarthritis, unspecified site: Secondary | ICD-10-CM | POA: Diagnosis not present

## 2022-09-05 DIAGNOSIS — N1831 Chronic kidney disease, stage 3a: Secondary | ICD-10-CM | POA: Diagnosis not present

## 2022-09-05 DIAGNOSIS — I129 Hypertensive chronic kidney disease with stage 1 through stage 4 chronic kidney disease, or unspecified chronic kidney disease: Secondary | ICD-10-CM | POA: Diagnosis not present

## 2022-09-05 DIAGNOSIS — R82998 Other abnormal findings in urine: Secondary | ICD-10-CM | POA: Diagnosis not present

## 2022-09-05 DIAGNOSIS — M8580 Other specified disorders of bone density and structure, unspecified site: Secondary | ICD-10-CM | POA: Diagnosis not present

## 2022-09-05 DIAGNOSIS — D692 Other nonthrombocytopenic purpura: Secondary | ICD-10-CM | POA: Diagnosis not present

## 2022-09-05 DIAGNOSIS — Z0000 Encounter for general adult medical examination without abnormal findings: Secondary | ICD-10-CM | POA: Diagnosis not present

## 2022-09-05 DIAGNOSIS — D696 Thrombocytopenia, unspecified: Secondary | ICD-10-CM | POA: Diagnosis not present

## 2022-09-05 DIAGNOSIS — L219 Seborrheic dermatitis, unspecified: Secondary | ICD-10-CM | POA: Diagnosis not present

## 2022-09-05 DIAGNOSIS — I10 Essential (primary) hypertension: Secondary | ICD-10-CM | POA: Diagnosis not present

## 2022-09-05 DIAGNOSIS — E1169 Type 2 diabetes mellitus with other specified complication: Secondary | ICD-10-CM | POA: Diagnosis not present

## 2022-09-05 DIAGNOSIS — M109 Gout, unspecified: Secondary | ICD-10-CM | POA: Diagnosis not present

## 2022-09-05 DIAGNOSIS — E785 Hyperlipidemia, unspecified: Secondary | ICD-10-CM | POA: Diagnosis not present

## 2023-03-18 DIAGNOSIS — M8580 Other specified disorders of bone density and structure, unspecified site: Secondary | ICD-10-CM | POA: Diagnosis not present

## 2023-03-18 DIAGNOSIS — N1831 Chronic kidney disease, stage 3a: Secondary | ICD-10-CM | POA: Diagnosis not present

## 2023-03-18 DIAGNOSIS — Z1389 Encounter for screening for other disorder: Secondary | ICD-10-CM | POA: Diagnosis not present

## 2023-03-18 DIAGNOSIS — L219 Seborrheic dermatitis, unspecified: Secondary | ICD-10-CM | POA: Diagnosis not present

## 2023-03-18 DIAGNOSIS — D696 Thrombocytopenia, unspecified: Secondary | ICD-10-CM | POA: Diagnosis not present

## 2023-03-18 DIAGNOSIS — I10 Essential (primary) hypertension: Secondary | ICD-10-CM | POA: Diagnosis not present

## 2023-03-18 DIAGNOSIS — R351 Nocturia: Secondary | ICD-10-CM | POA: Diagnosis not present

## 2023-03-18 DIAGNOSIS — E785 Hyperlipidemia, unspecified: Secondary | ICD-10-CM | POA: Diagnosis not present

## 2023-03-18 DIAGNOSIS — M1990 Unspecified osteoarthritis, unspecified site: Secondary | ICD-10-CM | POA: Diagnosis not present

## 2023-03-18 DIAGNOSIS — M109 Gout, unspecified: Secondary | ICD-10-CM | POA: Diagnosis not present

## 2023-03-18 DIAGNOSIS — E1169 Type 2 diabetes mellitus with other specified complication: Secondary | ICD-10-CM | POA: Diagnosis not present

## 2023-03-18 DIAGNOSIS — D692 Other nonthrombocytopenic purpura: Secondary | ICD-10-CM | POA: Diagnosis not present

## 2023-03-28 ENCOUNTER — Telehealth: Payer: Self-pay | Admitting: Hematology

## 2023-03-28 NOTE — Telephone Encounter (Signed)
Scheduled appointments per patients request via incoming call. Patient is aware of the made appointments.

## 2023-04-01 ENCOUNTER — Telehealth: Payer: Self-pay | Admitting: Hematology

## 2023-04-01 NOTE — Telephone Encounter (Signed)
Called and left message for patient, Dec appt was rescheduled for a January appt. Date and times were left on vm and appt reminder was sent out.

## 2023-04-22 ENCOUNTER — Other Ambulatory Visit: Payer: HMO

## 2023-04-22 ENCOUNTER — Ambulatory Visit: Payer: HMO | Admitting: Hematology

## 2023-05-22 ENCOUNTER — Other Ambulatory Visit: Payer: Self-pay

## 2023-05-22 NOTE — Progress Notes (Signed)
 HEMATOLOGY/ONCOLOGY CLINIC NOTE  Date of Service: 05/23/2023  Patient Care Team: Andre Skates, DO as PCP - General (Internal Medicine)  CHIEF COMPLAINTS/PURPOSE OF CONSULTATION:  Follow-up for continued management of iron overload disorder  HISTORY OF PRESENTING ILLNESS:  Please see previous note for details on initial presentation  INTERVAL HISTORY:   Andre Chen is a wonderful 83 y.o. male who is here for follow-up of his iron overload disorder.   Patient was last seen by me on 04/22/2022 and reported stable gout and was doing well overall.   Today, he is accompanied by his wife. Patient reports that he was seen by his PCP recently and was found to have high blood glucose, blood pressure, and iron levels. He reports that his ferritin was elevated at 500 at his PCP visit. Patient's ferritin was previously in the 200s over 1 year ago.   His BP in clinic today is 181/71. Patient reports that he takes his BP medication regularly at night.  His blood pressure at home ranges 135-145 -170s systolic during the day.   He reports that his Hgb A1C was recently 5.6 and continues to gradually increase.   Patient has lost 4 pounds in over a year, and his current weight is 198 pounds.   He reports that his blood sugars last year have ranged 105-120. His blood glucose level in clinic today is 140.   He denies any abdominal pain, leg swelling  He reports minimal red meat intake and denies alcohol  consumption. He denies taking any iron supplement.   He reports that he generally does consume garlic/lemon juice/water mixtures at night. He notes that his bp does improve after consuming this.   He reports that his hgb was low during the first week of December and it was noted that his hgb was lower at that time compared to his last visit with us .   He reports that he is on Trulicity at this time.  MEDICAL HISTORY:  Past Medical History:  Diagnosis Date   Cataracts, bilateral     Colon polyps    Diabetes mellitus without complication (HCC)    Gout    Hyperlipemia    Hypertension    Obesity   Benign Prostatic Hyperplasia BCC  SCC   SURGICAL HISTORY: Past Surgical History:  Procedure Laterality Date   APPENDECTOMY  1957   CATARACT EXTRACTION W/ INTRAOCULAR LENS  IMPLANT, BILATERAL  1/15 and 2/15   COLONOSCOPY  2004    SOCIAL HISTORY: Social History   Socioeconomic History   Marital status: Married    Spouse name: Not on file   Number of children: Not on file   Years of education: Not on file   Highest education level: Not on file  Occupational History   Not on file  Tobacco Use   Smoking status: Former    Current packs/day: 0.00    Types: Cigarettes    Quit date: 05/13/1972    Years since quitting: 51.0   Smokeless tobacco: Former    Quit date: 01/22/2000  Substance and Sexual Activity   Alcohol  use: No   Drug use: No   Sexual activity: Not on file  Other Topics Concern   Not on file  Social History Narrative   Not on file   Social Drivers of Health   Financial Resource Strain: Not on file  Food Insecurity: Not on file  Transportation Needs: Not on file  Physical Activity: Not on file  Stress: Not on file  Social Connections: Not on file  Intimate Partner Violence: Not on file    FAMILY HISTORY: Family History  Problem Relation Age of Onset   Colon cancer Neg Hx    Esophageal cancer Neg Hx    Rectal cancer Neg Hx    Stomach cancer Neg Hx     ALLERGIES:  has no known allergies.  MEDICATIONS:  Current Outpatient Medications  Medication Sig Dispense Refill   Alcohol  Swabs ([facility] PREP) 70 % PADS Apply 1 each topically 3 (three) times daily.     atenolol-chlorthalidone (TENORETIC) 100-25 MG per tablet Take 1 tablet by mouth daily.     colchicine 0.6 MG tablet Take 0.6 mg by mouth every other day.      doxazosin (CARDURA) 4 MG tablet Take 4 mg by mouth daily.     etodolac (LODINE) 400 MG tablet Take 400 mg by mouth as needed.       Febuxostat (ULORIC) 80 MG TABS Take 40 mg by mouth daily.      losartan (COZAAR) 100 MG tablet Take 50 mg by mouth daily.     magnesium hydroxide (MILK OF MAGNESIA) 400 MG/5ML suspension Take 30 mLs by mouth daily as needed for mild constipation.     OneTouch Delica Lancets 33G MISC Apply 1 each topically 3 (three) times daily.     ONETOUCH VERIO test strip 1 each daily.     OZEMPIC, 0.25 OR 0.5 MG/DOSE, 2 MG/1.5ML SOPN  (Patient not taking: Reported on 04/19/2020)     polyethylene glycol (MIRALAX / GLYCOLAX) 17 g packet Take 17 g by mouth daily. (Patient not taking: Reported on 04/19/2020)     TRULICITY 1.5 MG/0.5ML SOPN Inject into the skin.     No current facility-administered medications for this visit.    REVIEW OF SYSTEMS:    10 Point review of Systems was done is negative except as noted above.    PHYSICAL EXAMINATION: ECOG PERFORMANCE STATUS: 0 - Asymptomatic  . Vitals:   05/23/23 1349 05/23/23 1350  BP: (!) 190/84 (!) 181/71  Pulse: 67   Resp: 17   Temp: (!) 97.2 F (36.2 C)   SpO2: 100%      Filed Weights   05/23/23 1349  Weight: 198 lb 8 oz (90 kg)     .Body mass index is 28.89 kg/m.    GENERAL:alert, in no acute distress and comfortable SKIN: no acute rashes, no significant lesions EYES: conjunctiva are pink and non-injected, sclera anicteric OROPHARYNX: MMM, no exudates, no oropharyngeal erythema or ulceration NECK: supple, no JVD LYMPH:  no palpable lymphadenopathy in the cervical, axillary or inguinal regions LUNGS: clear to auscultation b/l with normal respiratory effort HEART: regular rate & rhythm ABDOMEN:  normoactive bowel sounds , non tender, not distended. Extremity: no pedal edema PSYCH: alert & oriented x 3 with fluent speech NEURO: no focal motor/sensory deficits    LABORATORY DATA:  I have reviewed the data as listed  .    Latest Ref Rng & Units 05/23/2023    1:19 PM 04/22/2022    1:12 PM 04/19/2021    1:40 PM  CBC  WBC 4.0 -  10.5 K/uL 8.1  7.3  6.8   Hemoglobin 13.0 - 17.0 g/dL 86.5  86.7  86.6   Hematocrit 39.0 - 52.0 % 39.4  39.3  38.4   Platelets 150 - 400 K/uL 180  146  159     .    Latest Ref Rng & Units 05/23/2023  1:19 PM 04/22/2022    1:12 PM 04/19/2021    1:40 PM  CMP  Glucose 70 - 99 mg/dL 876  899  93   BUN 8 - 23 mg/dL 38  33  38   Creatinine 0.61 - 1.24 mg/dL 8.39  8.29  8.12   Sodium 135 - 145 mmol/L 140  143  142   Potassium 3.5 - 5.1 mmol/L 4.1  4.2  4.2   Chloride 98 - 111 mmol/L 106  108  110   CO2 22 - 32 mmol/L 28  29  23    Calcium 8.9 - 10.3 mg/dL 9.9  89.6  9.4   Total Protein 6.5 - 8.1 g/dL 6.8  6.4  6.5   Total Bilirubin 0.0 - 1.2 mg/dL 0.4  0.4  0.4   Alkaline Phos 38 - 126 U/L 106  82  70   AST 15 - 41 U/L 22  16  18    ALT 0 - 44 U/L 37  23  27    . Lab Results  Component Value Date   IRON 60 04/22/2022   TIBC 295 04/22/2022   IRONPCTSAT 20 04/22/2022   (Iron and TIBC)  Lab Results  Component Value Date   FERRITIN 232 04/22/2022   . Lab Results  Component Value Date   IRON 55 05/23/2023   TIBC 295 05/23/2023   IRONPCTSAT 19 05/23/2023   (Iron and TIBC)  Lab Results  Component Value Date   FERRITIN 390 (H) 05/23/2023     07/21/2019:   RADIOGRAPHIC STUDIES: I have personally reviewed the radiological images as listed and agreed with the findings in the report. No results found.  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  12/29/2017 US  Abd ([id number]) revealed 1. Known renal cysts are not well visualized by ultrasound. 2. Mildly heterogeneous liver may reflect underlying liver disease.  07/21/2019 Hemochromatosis shows Single Mutation Identified (H63D) 07/28/2019 US  Abd ([id number]) revealed No evidence of cholelithiasis or biliary ductal dilatation. Diffuse hepatocellular disease, similar in appearance to prior  study. No hepatic mass visualized.  PLAN:  -Discussed lab results on 05/23/2023 in detail with patient. CBC normal, showed WBC of 8.1K, hemoglobin of 13.4, and platelets of 180K. -no anemia -CMP shows stable CKD with some improvement. Creatine improved from 1.70 one year ago to 1.60 currently -Liver enzymes normal -iron saturation 19% -ferritin level 390 -no indication for phlebotomy at this time -will plan to minimize the need for phlebotomies unless significantly necessary especially given his tendency for anemia -Holds of phlebotomy unless Ferritin is above 500. Allowing for some permissive increase in ferritin levels in the context of the patients CKD and poor tolerance of therapeutic phlebotomies and carrier status of HFE gene mutation. -educated patient that with more significant CKD, insulin stays around for longer, causing blood sugars to drop and there is sometimes need to drop cerain DM medications -Also discussed that with CKD, the bone marrow may require higher iron levels to ensure that he does not become anemic.  -educated patient that he is a carrier for H63D mutation, which generally does not cause significantly high iron levels.  -educated patient that sometimes ferritin can be high if it is overestimated due to the collected blood collected sample breaking up  -educated patient that there is generally no concern for significant organ damage unless ferritin ranges [phone]  -discussed that his  CKD can cause inflammation which can increase ferritin levels as ferritin is an acute phase reactant to inflammation  -recommend patient to regularly monitor blood pressure first thing in morning to determine his BP baseline and monitoring a couple times during the day when he is more relaxed. He shall connect with his PCP if his BP continues ot be elevated.  -educated patient that his elevated blood pressure could potentially be from dehydration -recommend consuming vegetables such as  asparagus with properties to help bring blood sugars down  -will plan for repeat labs in 6 months  FOLLOW UP: RTc with Dr Onesimo with labs in 6 months  The total time spent in the appointment was 25 minutes* .  All of the patient's questions were answered with apparent satisfaction. The patient knows to call the clinic with any problems, questions or concerns.   Emaline Onesimo MD MS AAHIVMS [facility] Hematology/Oncology Physician [facility]  .*Total Encounter Time as defined by the Centers for Medicare and Medicaid Services includes, in addition to the face-to-face time of a patient visit (documented in the note above) non-face-to-face time: obtaining and reviewing outside history, ordering and reviewing medications, tests or procedures, care coordination (communications with other health care professionals or caregivers) and documentation in the medical record.    I,Mitra Faeizi,acting as a neurosurgeon for Emaline Onesimo, MD.,have documented all relevant documentation on the behalf of Emaline Onesimo, MD,as directed by  Emaline Onesimo, MD while in the presence of Emaline Onesimo, MD.  .I have reviewed the above documentation for accuracy and completeness, and I agree with the above. .Gloriajean Okun Kishore Shi Grose MD

## 2023-05-23 ENCOUNTER — Inpatient Hospital Stay: Payer: HMO | Admitting: Hematology

## 2023-05-23 ENCOUNTER — Inpatient Hospital Stay: Payer: HMO | Attending: Hematology

## 2023-05-23 DIAGNOSIS — K769 Liver disease, unspecified: Secondary | ICD-10-CM | POA: Insufficient documentation

## 2023-05-23 DIAGNOSIS — R7989 Other specified abnormal findings of blood chemistry: Secondary | ICD-10-CM | POA: Diagnosis not present

## 2023-05-23 DIAGNOSIS — N189 Chronic kidney disease, unspecified: Secondary | ICD-10-CM | POA: Diagnosis not present

## 2023-05-23 LAB — CBC WITH DIFFERENTIAL (CANCER CENTER ONLY)
Abs Immature Granulocytes: 0.02 10*3/uL (ref 0.00–0.07)
Basophils Absolute: 0.1 10*3/uL (ref 0.0–0.1)
Basophils Relative: 1 %
Eosinophils Absolute: 0.3 10*3/uL (ref 0.0–0.5)
Eosinophils Relative: 4 %
HCT: 39.4 % (ref 39.0–52.0)
Hemoglobin: 13.4 g/dL (ref 13.0–17.0)
Immature Granulocytes: 0 %
Lymphocytes Relative: 13 %
Lymphs Abs: 1.1 10*3/uL (ref 0.7–4.0)
MCH: 32.2 pg (ref 26.0–34.0)
MCHC: 34 g/dL (ref 30.0–36.0)
MCV: 94.7 fL (ref 80.0–100.0)
Monocytes Absolute: 0.7 10*3/uL (ref 0.1–1.0)
Monocytes Relative: 8 %
Neutro Abs: 6 10*3/uL (ref 1.7–7.7)
Neutrophils Relative %: 74 %
Platelet Count: 180 10*3/uL (ref 150–400)
RBC: 4.16 MIL/uL — ABNORMAL LOW (ref 4.22–5.81)
RDW: 11.9 % (ref 11.5–15.5)
WBC Count: 8.1 10*3/uL (ref 4.0–10.5)
nRBC: 0 % (ref 0.0–0.2)

## 2023-05-23 LAB — CMP (CANCER CENTER ONLY)
ALT: 37 U/L (ref 0–44)
AST: 22 U/L (ref 15–41)
Albumin: 4.1 g/dL (ref 3.5–5.0)
Alkaline Phosphatase: 106 U/L (ref 38–126)
Anion gap: 6 (ref 5–15)
BUN: 38 mg/dL — ABNORMAL HIGH (ref 8–23)
CO2: 28 mmol/L (ref 22–32)
Calcium: 9.9 mg/dL (ref 8.9–10.3)
Chloride: 106 mmol/L (ref 98–111)
Creatinine: 1.6 mg/dL — ABNORMAL HIGH (ref 0.61–1.24)
GFR, Estimated: 43 mL/min — ABNORMAL LOW (ref >60)
Glucose, Bld: 123 mg/dL — ABNORMAL HIGH (ref 70–99)
Potassium: 4.1 mmol/L (ref 3.5–5.1)
Sodium: 140 mmol/L (ref 135–145)
Total Bilirubin: 0.4 mg/dL (ref 0.0–1.2)
Total Protein: 6.8 g/dL (ref 6.5–8.1)

## 2023-05-23 LAB — IRON AND IRON BINDING CAPACITY (CC-WL,HP ONLY)
Iron: 55 ug/dL (ref 45–182)
Saturation Ratios: 19 % (ref 17.9–39.5)
TIBC: 295 ug/dL (ref 250–450)
UIBC: 240 ug/dL (ref 117–376)

## 2023-05-23 LAB — FERRITIN: Ferritin: 390 ng/mL — ABNORMAL HIGH (ref 24–336)

## 2023-05-29 ENCOUNTER — Encounter: Payer: Self-pay | Admitting: Hematology

## 2023-07-01 DIAGNOSIS — M25511 Pain in right shoulder: Secondary | ICD-10-CM | POA: Diagnosis not present

## 2023-07-01 DIAGNOSIS — E1169 Type 2 diabetes mellitus with other specified complication: Secondary | ICD-10-CM | POA: Diagnosis not present

## 2023-07-01 DIAGNOSIS — M25512 Pain in left shoulder: Secondary | ICD-10-CM | POA: Diagnosis not present

## 2023-07-01 DIAGNOSIS — M25561 Pain in right knee: Secondary | ICD-10-CM | POA: Diagnosis not present

## 2023-07-01 DIAGNOSIS — I10 Essential (primary) hypertension: Secondary | ICD-10-CM | POA: Diagnosis not present

## 2023-07-01 DIAGNOSIS — M25562 Pain in left knee: Secondary | ICD-10-CM | POA: Diagnosis not present

## 2023-07-22 DIAGNOSIS — M19012 Primary osteoarthritis, left shoulder: Secondary | ICD-10-CM | POA: Diagnosis not present

## 2023-07-22 DIAGNOSIS — M19011 Primary osteoarthritis, right shoulder: Secondary | ICD-10-CM | POA: Diagnosis not present

## 2023-07-22 DIAGNOSIS — M25512 Pain in left shoulder: Secondary | ICD-10-CM | POA: Diagnosis not present

## 2023-07-22 DIAGNOSIS — M25511 Pain in right shoulder: Secondary | ICD-10-CM | POA: Diagnosis not present

## 2023-09-02 DIAGNOSIS — R351 Nocturia: Secondary | ICD-10-CM | POA: Diagnosis not present

## 2023-09-02 DIAGNOSIS — E785 Hyperlipidemia, unspecified: Secondary | ICD-10-CM | POA: Diagnosis not present

## 2023-09-02 DIAGNOSIS — I129 Hypertensive chronic kidney disease with stage 1 through stage 4 chronic kidney disease, or unspecified chronic kidney disease: Secondary | ICD-10-CM | POA: Diagnosis not present

## 2023-09-02 DIAGNOSIS — E559 Vitamin D deficiency, unspecified: Secondary | ICD-10-CM | POA: Diagnosis not present

## 2023-09-02 DIAGNOSIS — N1831 Chronic kidney disease, stage 3a: Secondary | ICD-10-CM | POA: Diagnosis not present

## 2023-09-02 DIAGNOSIS — M109 Gout, unspecified: Secondary | ICD-10-CM | POA: Diagnosis not present

## 2023-09-02 DIAGNOSIS — E1169 Type 2 diabetes mellitus with other specified complication: Secondary | ICD-10-CM | POA: Diagnosis not present

## 2023-09-09 DIAGNOSIS — Z1339 Encounter for screening examination for other mental health and behavioral disorders: Secondary | ICD-10-CM | POA: Diagnosis not present

## 2023-09-09 DIAGNOSIS — E559 Vitamin D deficiency, unspecified: Secondary | ICD-10-CM | POA: Diagnosis not present

## 2023-09-09 DIAGNOSIS — E1169 Type 2 diabetes mellitus with other specified complication: Secondary | ICD-10-CM | POA: Diagnosis not present

## 2023-09-09 DIAGNOSIS — M2550 Pain in unspecified joint: Secondary | ICD-10-CM | POA: Diagnosis not present

## 2023-09-09 DIAGNOSIS — Z1331 Encounter for screening for depression: Secondary | ICD-10-CM | POA: Diagnosis not present

## 2023-09-09 DIAGNOSIS — I10 Essential (primary) hypertension: Secondary | ICD-10-CM | POA: Diagnosis not present

## 2023-09-09 DIAGNOSIS — Z0000 Encounter for general adult medical examination without abnormal findings: Secondary | ICD-10-CM | POA: Diagnosis not present

## 2023-09-09 DIAGNOSIS — N1831 Chronic kidney disease, stage 3a: Secondary | ICD-10-CM | POA: Diagnosis not present

## 2023-09-09 DIAGNOSIS — R351 Nocturia: Secondary | ICD-10-CM | POA: Diagnosis not present

## 2023-09-09 DIAGNOSIS — R82998 Other abnormal findings in urine: Secondary | ICD-10-CM | POA: Diagnosis not present

## 2023-09-09 DIAGNOSIS — M109 Gout, unspecified: Secondary | ICD-10-CM | POA: Diagnosis not present

## 2023-09-09 DIAGNOSIS — M1990 Unspecified osteoarthritis, unspecified site: Secondary | ICD-10-CM | POA: Diagnosis not present

## 2023-09-09 DIAGNOSIS — E785 Hyperlipidemia, unspecified: Secondary | ICD-10-CM | POA: Diagnosis not present

## 2023-10-14 DIAGNOSIS — M8589 Other specified disorders of bone density and structure, multiple sites: Secondary | ICD-10-CM | POA: Diagnosis not present

## 2023-11-21 ENCOUNTER — Other Ambulatory Visit: Payer: Self-pay

## 2023-11-23 NOTE — Progress Notes (Signed)
 HEMATOLOGY/ONCOLOGY CLINIC NOTE  Date of Service: 11/24/2023  Patient Care Team: Valentin Skates, DO as PCP - General (Internal Medicine)  CHIEF COMPLAINTS/PURPOSE OF CONSULTATION:  Follow-up for continued management of iron overload disorder  HISTORY OF PRESENTING ILLNESS:  Please see previous note for details on initial presentation  INTERVAL HISTORY:   Andre Chen is a wonderful 83 y.o. male who is here for follow-up of his iron overload disorder.   Patient was last seen by me on 05/23/2023 and reported elevated blood pressure, blood glucose, and iron levels. He also reported a 4-pound weight loss over 1 year.  Patient notes no acute new symptoms since his last visit. He does not tolerate therapeutic phlebotomy and agrees with being conservative with these.   MEDICAL HISTORY:  Past Medical History:  Diagnosis Date   Cataracts, bilateral    Colon polyps    Diabetes mellitus without complication (HCC)    Gout    Hyperlipemia    Hypertension    Obesity   Benign Prostatic Hyperplasia BCC  SCC   SURGICAL HISTORY: Past Surgical History:  Procedure Laterality Date   APPENDECTOMY  1957   CATARACT EXTRACTION W/ INTRAOCULAR LENS  IMPLANT, BILATERAL  1/15 and 2/15   COLONOSCOPY  2004    SOCIAL HISTORY: Social History   Socioeconomic History   Marital status: Married    Spouse name: Not on file   Number of children: Not on file   Years of education: Not on file   Highest education level: Not on file  Occupational History   Not on file  Tobacco Use   Smoking status: Former    Current packs/day: 0.00    Types: Cigarettes    Quit date: 05/13/1972    Years since quitting: 51.5   Smokeless tobacco: Former    Quit date: 01/22/2000  Substance and Sexual Activity   Alcohol  use: No   Drug use: No   Sexual activity: Not on file  Other Topics Concern   Not on file  Social History Narrative   Not on file   Social Drivers of Health   Financial Resource Strain:  Not on file  Food Insecurity: Not on file  Transportation Needs: Not on file  Physical Activity: Not on file  Stress: Not on file  Social Connections: Not on file  Intimate Partner Violence: Not on file    FAMILY HISTORY: Family History  Problem Relation Age of Onset   Colon cancer Neg Hx    Esophageal cancer Neg Hx    Rectal cancer Neg Hx    Stomach cancer Neg Hx     ALLERGIES:  has no known allergies.  MEDICATIONS:  Current Outpatient Medications  Medication Sig Dispense Refill   Alcohol  Swabs ([facility] PREP) 70 % PADS Apply 1 each topically 3 (three) times daily.     atenolol-chlorthalidone (TENORETIC) 100-25 MG per tablet Take 1 tablet by mouth daily.     colchicine 0.6 MG tablet Take 0.6 mg by mouth every other day.      doxazosin (CARDURA) 4 MG tablet Take 4 mg by mouth daily.     etodolac (LODINE) 400 MG tablet Take 400 mg by mouth as needed.      Febuxostat (ULORIC) 80 MG TABS Take 40 mg by mouth daily.      losartan (COZAAR) 100 MG tablet Take 50 mg by mouth daily.     magnesium hydroxide (MILK OF MAGNESIA) 400 MG/5ML suspension Take 30 mLs by mouth daily as  needed for mild constipation.     OneTouch Delica Lancets 33G MISC Apply 1 each topically 3 (three) times daily.     ONETOUCH VERIO test strip 1 each daily.     OZEMPIC, 0.25 OR 0.5 MG/DOSE, 2 MG/1.5ML SOPN  (Patient not taking: Reported on 04/19/2020)     polyethylene glycol (MIRALAX / GLYCOLAX) 17 g packet Take 17 g by mouth daily. (Patient not taking: Reported on 04/19/2020)     TRULICITY 1.5 MG/0.5ML SOPN Inject into the skin.     No current facility-administered medications for this visit.    REVIEW OF SYSTEMS:    10 Point review of Systems was done is negative except as noted above.    PHYSICAL EXAMINATION: ECOG PERFORMANCE STATUS: 0 - Asymptomatic  . Vitals:   11/24/23 1420  BP: (!) 129/59  Pulse: 65  Resp: 18  Temp: (!) 97.3 F (36.3 C)  SpO2: 97%   Filed Weights   11/24/23 1420  Weight: 195  lb 11.2 oz (88.8 kg)  .Body mass index is 28.49 kg/m.  GENERAL:alert, in no acute distress and comfortable SKIN: no acute rashes, no significant lesions EYES: conjunctiva are pink and non-injected, sclera anicteric OROPHARYNX: MMM, no exudates, no oropharyngeal erythema or ulceration NECK: supple, no JVD LYMPH:  no palpable lymphadenopathy in the cervical, axillary or inguinal regions LUNGS: clear to auscultation b/l with normal respiratory effort HEART: regular rate & rhythm ABDOMEN:  normoactive bowel sounds , non tender, not distended. Extremity: no pedal edema PSYCH: alert & oriented x 3 with fluent speech NEURO: no focal motor/sensory deficits   LABORATORY DATA:  I have reviewed the data as listed  .    Latest Ref Rng & Units 11/24/2023    1:34 PM 05/23/2023    1:19 PM 04/22/2022    1:12 PM  CBC  WBC 4.0 - 10.5 K/uL 7.3  8.1  7.3   Hemoglobin 13.0 - 17.0 g/dL 86.5  86.5  86.7   Hematocrit 39.0 - 52.0 % 40.1  39.4  39.3   Platelets 150 - 400 K/uL 175  180  146     .    Latest Ref Rng & Units 11/24/2023    1:34 PM 05/23/2023    1:19 PM 04/22/2022    1:12 PM  CMP  Glucose 70 - 99 mg/dL 838  876  899   BUN 8 - 23 mg/dL 40  38  33   Creatinine 0.61 - 1.24 mg/dL 8.12  8.39  8.29   Sodium 135 - 145 mmol/L 142  140  143   Potassium 3.5 - 5.1 mmol/L 4.3  4.1  4.2   Chloride 98 - 111 mmol/L 108  106  108   CO2 22 - 32 mmol/L 29  28  29    Calcium 8.9 - 10.3 mg/dL 9.5  9.9  89.6   Total Protein 6.5 - 8.1 g/dL 6.4  6.8  6.4   Total Bilirubin 0.0 - 1.2 mg/dL 0.4  0.4  0.4   Alkaline Phos 38 - 126 U/L 83  106  82   AST 15 - 41 U/L 16  22  16    ALT 0 - 44 U/L 22  37  23    . Lab Results  Component Value Date   IRON 55 05/23/2023   TIBC 295 05/23/2023   IRONPCTSAT 19 05/23/2023   (Iron and TIBC)  Lab Results  Component Value Date   FERRITIN 390 (H) 05/23/2023   . Lab Results  Component Value Date   IRON 55 05/23/2023   TIBC 295 05/23/2023   IRONPCTSAT 19  05/23/2023   (Iron and TIBC)  Lab Results  Component Value Date   FERRITIN 526 (H) 11/24/2023     07/21/2019:   RADIOGRAPHIC STUDIES: I have personally reviewed the radiological images as listed and agreed with the findings in the report. No results found.  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  12/29/2017 US  Abd ([id number]) revealed 1. Known renal cysts are not well visualized by ultrasound. 2. Mildly heterogeneous liver may reflect underlying liver disease.  07/21/2019 Hemochromatosis shows Single Mutation Identified (H63D) 07/28/2019 US  Abd ([id number]) revealed No evidence of cholelithiasis or biliary ductal dilatation. Diffuse hepatocellular disease, similar in appearance to prior study. No hepatic mass visualized.  PLAN:  -discussed lab results from today, 714/2025, in detail with patient.  Cbc- wnl hgb 13.4 Cmp with ckd creatinine 1.87 Ferritin 526 and iron saturation of 31% No therpaeutic phlebotomy at this tie due to poor tolerance and CKD Goal will be to keep ferritin <750 and iron saturation <60%  FOLLOW UP:  RTC with Dr Onesimo with labs in 12 months   The total time spent in the appointment was 20 minutes* .  All of the patient's questions were answered with apparent satisfaction. The patient knows to call the clinic with any problems, questions or concerns.   Emaline Onesimo MD MS AAHIVMS [facility] Hematology/Oncology Physician [facility]  .*Total Encounter Time as defined by the Centers for Medicare and Medicaid Services includes, in addition to the face-to-face time of a patient visit (documented in the note above) non-face-to-face time: obtaining and reviewing outside history, ordering and reviewing medications, tests or procedures, care coordination (communications with other health care professionals or  caregivers) and documentation in the medical record.    I,Mitra Faeizi,acting as a Neurosurgeon for Emaline Onesimo, MD.,have documented all relevant documentation on the behalf of Emaline Onesimo, MD,as directed by  Emaline Onesimo, MD while in the presence of Emaline Onesimo, MD.  .I have reviewed the above documentation for accuracy and completeness, and I agree with the above. .Vennie Waymire Kishore Andreus Cure MD

## 2023-11-24 ENCOUNTER — Inpatient Hospital Stay: Payer: HMO | Attending: Hematology | Admitting: Hematology

## 2023-11-24 ENCOUNTER — Inpatient Hospital Stay: Payer: HMO

## 2023-11-24 ENCOUNTER — Encounter: Payer: Self-pay | Admitting: Hematology

## 2023-11-24 DIAGNOSIS — R7989 Other specified abnormal findings of blood chemistry: Secondary | ICD-10-CM | POA: Diagnosis not present

## 2023-11-24 DIAGNOSIS — N189 Chronic kidney disease, unspecified: Secondary | ICD-10-CM | POA: Insufficient documentation

## 2023-11-24 LAB — CBC WITH DIFFERENTIAL (CANCER CENTER ONLY)
Abs Immature Granulocytes: 0.04 K/uL (ref 0.00–0.07)
Basophils Absolute: 0.1 K/uL (ref 0.0–0.1)
Basophils Relative: 1 %
Eosinophils Absolute: 0.3 K/uL (ref 0.0–0.5)
Eosinophils Relative: 5 %
HCT: 40.1 % (ref 39.0–52.0)
Hemoglobin: 13.4 g/dL (ref 13.0–17.0)
Immature Granulocytes: 1 %
Lymphocytes Relative: 16 %
Lymphs Abs: 1.2 K/uL (ref 0.7–4.0)
MCH: 31.8 pg (ref 26.0–34.0)
MCHC: 33.4 g/dL (ref 30.0–36.0)
MCV: 95.2 fL (ref 80.0–100.0)
Monocytes Absolute: 0.6 K/uL (ref 0.1–1.0)
Monocytes Relative: 8 %
Neutro Abs: 5.2 K/uL (ref 1.7–7.7)
Neutrophils Relative %: 69 %
Platelet Count: 175 K/uL (ref 150–400)
RBC: 4.21 MIL/uL — ABNORMAL LOW (ref 4.22–5.81)
RDW: 12.5 % (ref 11.5–15.5)
WBC Count: 7.3 K/uL (ref 4.0–10.5)
nRBC: 0 % (ref 0.0–0.2)

## 2023-11-24 LAB — CMP (CANCER CENTER ONLY)
ALT: 22 U/L (ref 0–44)
AST: 16 U/L (ref 15–41)
Albumin: 4 g/dL (ref 3.5–5.0)
Alkaline Phosphatase: 83 U/L (ref 38–126)
Anion gap: 5 (ref 5–15)
BUN: 40 mg/dL — ABNORMAL HIGH (ref 8–23)
CO2: 29 mmol/L (ref 22–32)
Calcium: 9.5 mg/dL (ref 8.9–10.3)
Chloride: 108 mmol/L (ref 98–111)
Creatinine: 1.87 mg/dL — ABNORMAL HIGH (ref 0.61–1.24)
GFR, Estimated: 36 mL/min — ABNORMAL LOW (ref >60)
Glucose, Bld: 161 mg/dL — ABNORMAL HIGH (ref 70–99)
Potassium: 4.3 mmol/L (ref 3.5–5.1)
Sodium: 142 mmol/L (ref 135–145)
Total Bilirubin: 0.4 mg/dL (ref 0.0–1.2)
Total Protein: 6.4 g/dL — ABNORMAL LOW (ref 6.5–8.1)

## 2023-11-24 LAB — FERRITIN: Ferritin: 526 ng/mL — ABNORMAL HIGH (ref 24–336)

## 2023-11-24 LAB — IRON AND IRON BINDING CAPACITY (CC-WL,HP ONLY)
Iron: 85 ug/dL (ref 45–182)
Saturation Ratios: 31 % (ref 17.9–39.5)
TIBC: 279 ug/dL (ref 250–450)
UIBC: 194 ug/dL (ref 117–376)

## 2023-12-01 ENCOUNTER — Encounter: Payer: Self-pay | Admitting: Hematology

## 2023-12-03 ENCOUNTER — Encounter: Payer: Self-pay | Admitting: Hematology

## 2024-03-18 DIAGNOSIS — E1169 Type 2 diabetes mellitus with other specified complication: Secondary | ICD-10-CM | POA: Diagnosis not present

## 2024-03-18 DIAGNOSIS — E559 Vitamin D deficiency, unspecified: Secondary | ICD-10-CM | POA: Diagnosis not present

## 2024-11-29 ENCOUNTER — Other Ambulatory Visit

## 2024-11-29 ENCOUNTER — Ambulatory Visit: Admitting: Hematology
